# Patient Record
Sex: Female | Born: 1995 | Race: White | Hispanic: No | Marital: Single | Smoking: Never smoker
Health system: Southern US, Community
[De-identification: ages and names within clinical notes are randomized; demographics above are authoritative.]

## PROBLEM LIST (undated history)

## (undated) HISTORY — PX: TONSILLECTOMY: SUR1361

---
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
## 2016-10-01 ENCOUNTER — Encounter: Payer: Self-pay | Admitting: Certified Nurse Midwife

## 2016-10-11 ENCOUNTER — Ambulatory Visit (INDEPENDENT_AMBULATORY_CARE_PROVIDER_SITE_OTHER): Payer: Self-pay | Admitting: Certified Nurse Midwife

## 2016-10-11 ENCOUNTER — Encounter: Payer: Self-pay | Admitting: Certified Nurse Midwife

## 2016-10-11 VITALS — BP 127/86 | HR 66 | Ht 67.0 in | Wt 223.4 lb

## 2016-10-11 DIAGNOSIS — Z01419 Encounter for gynecological examination (general) (routine) without abnormal findings: Secondary | ICD-10-CM

## 2016-10-11 DIAGNOSIS — Z124 Encounter for screening for malignant neoplasm of cervix: Secondary | ICD-10-CM

## 2016-10-11 DIAGNOSIS — Z6834 Body mass index (BMI) 34.0-34.9, adult: Secondary | ICD-10-CM

## 2016-10-11 DIAGNOSIS — Z842 Family history of other diseases of the genitourinary system: Secondary | ICD-10-CM

## 2016-10-11 DIAGNOSIS — N6459 Other signs and symptoms in breast: Secondary | ICD-10-CM

## 2016-10-11 NOTE — Patient Instructions (Signed)
Diet for Polycystic Ovarian Syndrome Polycystic ovary syndrome (PCOS) is a disorder of the chemical messengers (hormones) that regulate menstruation. The condition causes important hormones to be out of balance. PCOS can:  Make your periods irregular or stop.  Cause cysts to develop on the ovaries.  Make it difficult to get pregnant.  Stop your body from responding to the effects of insulin (insulin resistance), which can lead to obesity and diabetes. Changing what you eat can help manage PCOS and improve your health. It can help you lose weight and improve the way your body uses insulin. What is my plan?  Eat breakfast, lunch, and dinner plus two snacks every day.  Include protein in each meal and snack.  Choose whole grains instead of products made with refined flour.  Eat a variety of foods.  Exercise regularly as told by your health care provider. What do I need to know about this eating plan? If you are overweight or obese, pay attention to how many calories you eat. Cutting down on calories can help you lose weight. Work with your health care provider or dietitian to figure out how many calories you need each day. What foods can I eat? Grains  Whole grains, such as whole wheat. Whole-grain breads, crackers, cereals, and pasta. Unsweetened oatmeal, bulgur, barley, quinoa, or brown rice. Corn or whole-wheat flour tortillas. Vegetables   Lettuce. Spinach. Peas. Beets. Cauliflower. Cabbage. Broccoli. Carrots. Tomatoes. Squash. Eggplant. Herbs. Peppers. Onions. Cucumbers. Brussels sprouts. Fruits  Berries. Bananas. Apples. Oranges. Grapes. Papaya. Mango. Pomegranate. Kiwi. Grapefruit. Cherries. Meats and Other Protein Sources  Lean proteins, such as fish, chicken, beans, eggs, and tofu. Dairy  Low-fat dairy products, such as skim milk, cheese sticks, and yogurt. Beverages  Low-fat or fat-free drinks, such as water, low-fat milk, sugar-free drinks, and 100% fruit  juice. Condiments  Ketchup. Mustard. Barbecue sauce. Relish. Low-fat or fat-free mayonnaise. Fats and Oils  Olive oil or canola oil. Walnuts and almonds. The items listed above may not be a complete list of recommended foods or beverages. Contact your dietitian for more options.  What foods are not recommended? Foods high in calories or fat. Fried foods. Sweets. Products made from refined white flour, including white bread, pastries, white rice, and pasta. The items listed above may not be a complete list of foods and beverages to avoid. Contact your dietitian for more information.  This information is not intended to replace advice given to you by your health care provider. Make sure you discuss any questions you have with your health care provider. Document Released: 09/25/2015 Document Revised: 11/09/2015 Document Reviewed: 06/15/2014 Elsevier Interactive Patient Education  2017 [employer].

## 2016-10-13 DIAGNOSIS — N6459 Other signs and symptoms in breast: Secondary | ICD-10-CM | POA: Insufficient documentation

## 2016-10-13 DIAGNOSIS — Z6834 Body mass index (BMI) 34.0-34.9, adult: Secondary | ICD-10-CM | POA: Insufficient documentation

## 2016-10-13 DIAGNOSIS — Z842 Family history of other diseases of the genitourinary system: Secondary | ICD-10-CM | POA: Insufficient documentation

## 2016-10-13 NOTE — Progress Notes (Signed)
ANNUAL PREVENTATIVE CARE GYN  ENCOUNTER NOTE  Subjective:       Linda Durham is a 21 y.o. G0P0000 female here for a routine annual gynecologic exam.  Her sister was recently diagnosed with PCOS and Linda Durham "sees similar symptoms in herself".   Linda Durham reports history of irregular cycles, excessive facial hair, ance, and thinning hair to her head.   Over the last six (6) months, she has last forty pounds and now her menstrual cycles are regular.   She reports bilateral inverted nipples, this is not a change, but is something she has not told anyone.   She requests lab work for confirmation of PCOS, if possible.   Denies difficulty breathing respiratory distress, chest pain, abdominal pain, dysuria, unexplained vaginal bleeding, and leg pain or swelling.   Gynecologic History  Patient's last menstrual period was 09/19/2016.  Contraception: abstinence  Last Pap: N/A.   Period Cycle (Days): 28 Period Duration (Days): 5-7 Period Pattern: Regular Menstrual Flow: Heavy Dysmenorrhea: (!) Mild Dysmenorrhea Symptoms: Cramping   Obstetric History OB History  Gravida Para Term Preterm AB Living  0 0 0 0 0 0  SAB TAB Ectopic Multiple Live Births  0 0 0 0 0        History reviewed. No pertinent past medical history.  Past Surgical History:  Procedure Laterality Date  . TONSILLECTOMY      No current outpatient prescriptions on file prior to visit.   No current facility-administered medications on file prior to visit.     No Known Allergies  Social History   Social History  . Marital status: Single    Spouse name: N/A  . Number of children: N/A  . Years of education: N/A   Occupational History  . Not on file.   Social History Main Topics  . Smoking status: Never Smoker  . Smokeless tobacco: Never Used  . Alcohol use Yes     Comment: Socially   . Drug use: No  . Sexual activity: No   Other Topics Concern  . Not on file   Social History Narrative  . No  narrative on file    Family History  Problem Relation Age of Onset  . Diabetes Father   . Heart disease Father   . Breast cancer Neg Hx   . Migraines Neg Hx   . Rashes / Skin problems Neg Hx     The following portions of the patient's history were reviewed and updated as appropriate: allergies, current medications, past family history, past medical history, past social history, past surgical history and problem list.  Review of Systems  ROS negative except as noted above. Information obtained from patient.    Objective:   BP 127/86 (BP Location: Left Arm, Patient Position: Sitting, Cuff Size: Normal)   Pulse 66   Ht  (1.702 m)   Wt 223 lb 6.4 oz (101.3 kg)   LMP 09/19/2016   BMI 34.99 kg/m    CONSTITUTIONAL: Well-developed, well-nourished female in no acute distress.   PSYCHIATRIC: Normal mood and affect. Normal behavior. Normal judgment and thought content.  NEUROLGIC: Alert and oriented to person, place, and time. Normal muscle tone coordination. No cranial nerve deficit noted.  HENT:  Normocephalic, atraumatic, External right and  left ear normal. Oropharynx is clear and moist  EYES: Conjunctivae and EOM are normal. Pupils are equal, round, and reactive to light. No scleral icterus.   NECK: Normal range of motion, supple, no masses.  Normal thyroid.  SKIN: Skin is warm and dry. No rash noted. Not diaphoretic. No erythema. No pallor.  CARDIOVASCULAR: Normal heart rate noted, regular rhythm, no murmur.  RESPIRATORY: Clear to auscultation bilaterally. Effort and breath sounds normal, no problems with respiration noted.  BREASTS: Symmetric in size. No masses, skin changes, nipple drainage, or lymphadenopathy. Bilateral inverted nipples.   ABDOMEN: Soft, normal bowel sounds, no distention noted.  No tenderness, rebound or guarding.   PELVIC:  External Genitalia: Normal  Vagina: Normal  Cervix: Normal  Uterus: Normal  Adnexa: Normal   MUSCULOSKELETAL:  Normal range of motion. No tenderness.  No cyanosis, clubbing, or edema.  2+ distal pulses.  LYMPHATIC: No Axillary, Supraclavicular, or Inguinal Adenopathy.  Assessment:   Annual gynecologic 39examination 20 y.o.   Contraception: abstinence   Obesity 1   Problem List Items Addressed This Visit      Other   Inverted nipple   BMI 34.0-34.9,adult   Relevant Orders   TSH   Hemoglobin A1c   Lipid panel   Family history of polycystic ovarian syndrome - Primary   Relevant Orders   TSH   Hemoglobin A1c   Lipid panel   Testosterone, Free, Total, SHBG    Other Visit Diagnoses    Well woman exam       Relevant Orders   Pap IG, CT/NG w/ reflex HPV when ASC-U   CBC   Comprehensive metabolic panel   TSH   Hemoglobin A1c   Lipid panel   Testosterone, Free, Total, SHBG   Pap smear for cervical cancer screening       Relevant Orders   Pap IG, CT/NG w/ reflex HPV when ASC-U      Plan:   Pap: Pap, Reflex if ASCUS  Labs: Lipid 1, FBS, TSH, Hemoglobin A1C and Vit D Level""See orders.    Routine preventative health maintenance measures emphasized: Exercise/Diet/Weight control, Alcohol/Substance use risks, Stress Management, Peer Pressure Issues and Safe Sex.   Suspect PCOS with cycle regulation due to Lifestyle modifications. Discussed types of PCOS and symptom management options with pt. Would like to wait for lab results before starting OCPs.   Return to Clinic - 1 Year or sooner if needed.    Gunnar Bulla, CNM

## 2016-10-14 ENCOUNTER — Encounter: Payer: Self-pay | Admitting: Certified Nurse Midwife

## 2016-10-15 ENCOUNTER — Other Ambulatory Visit: Payer: Self-pay

## 2016-10-15 DIAGNOSIS — Z6834 Body mass index (BMI) 34.0-34.9, adult: Secondary | ICD-10-CM

## 2016-10-15 DIAGNOSIS — Z01419 Encounter for gynecological examination (general) (routine) without abnormal findings: Secondary | ICD-10-CM

## 2016-10-15 DIAGNOSIS — Z842 Family history of other diseases of the genitourinary system: Secondary | ICD-10-CM

## 2016-10-15 LAB — PAP IG, CT-NG, RFX HPV ASCU
CHLAMYDIA, NUC. ACID AMP: NEGATIVE
Gonococcus by Nucleic Acid Amp: NEGATIVE
PAP Smear Comment: 0

## 2016-10-17 LAB — COMPREHENSIVE METABOLIC PANEL
A/G RATIO: 1.7 (ref 1.2–2.2)
ALK PHOS: 60 IU/L (ref 39–117)
ALT: 11 IU/L (ref 0–32)
AST: 17 IU/L (ref 0–40)
Albumin: 4.7 g/dL (ref 3.5–5.5)
BUN/Creatinine Ratio: 19 (ref 9–23)
BUN: 16 mg/dL (ref 6–20)
Bilirubin Total: 0.3 mg/dL (ref 0.0–1.2)
CO2: 23 mmol/L (ref 18–29)
Calcium: 9.6 mg/dL (ref 8.7–10.2)
Chloride: 98 mmol/L (ref 96–106)
Creatinine, Ser: 0.86 mg/dL (ref 0.57–1.00)
GFR calc Af Amer: 112 mL/min/{1.73_m2} (ref >59)
GFR, EST NON AFRICAN AMERICAN: 97 mL/min/{1.73_m2} (ref >59)
GLOBULIN, TOTAL: 2.7 g/dL (ref 1.5–4.5)
Glucose: 76 mg/dL (ref 65–99)
POTASSIUM: 4.4 mmol/L (ref 3.5–5.2)
SODIUM: 140 mmol/L (ref 134–144)
Total Protein: 7.4 g/dL (ref 6.0–8.5)

## 2016-10-17 LAB — LIPID PANEL
CHOL/HDL RATIO: 3.1 ratio (ref 0.0–4.4)
Cholesterol, Total: 174 mg/dL (ref 100–199)
HDL: 56 mg/dL (ref >39)
LDL CALC: 108 mg/dL — AB (ref 0–99)
TRIGLYCERIDES: 50 mg/dL (ref 0–149)
VLDL CHOLESTEROL CAL: 10 mg/dL (ref 5–40)

## 2016-10-17 LAB — HEMOGLOBIN A1C
Est. average glucose Bld gHb Est-mCnc: 108 mg/dL
Hgb A1c MFr Bld: 5.4 % (ref 4.8–5.6)

## 2016-10-17 LAB — TESTOSTERONE, FREE, TOTAL, SHBG
Sex Hormone Binding: 23 nmol/L — ABNORMAL LOW (ref 24.6–122.0)
TESTOSTERONE: 48 ng/dL (ref 8–48)
Testosterone, Free: 3.2 pg/mL (ref 0.0–4.2)

## 2016-10-17 LAB — CBC
HEMATOCRIT: 36.8 % (ref 34.0–46.6)
Hemoglobin: 11.5 g/dL (ref 11.1–15.9)
MCH: 25.4 pg — AB (ref 26.6–33.0)
MCHC: 31.3 g/dL — AB (ref 31.5–35.7)
MCV: 81 fL (ref 79–97)
Platelets: 331 10*3/uL (ref 150–379)
RBC: 4.53 x10E6/uL (ref 3.77–5.28)
RDW: 16 % — AB (ref 12.3–15.4)
WBC: 10.4 10*3/uL (ref 3.4–10.8)

## 2016-10-17 LAB — TSH: TSH: 1.81 u[IU]/mL (ref 0.450–4.500)

## 2016-10-29 ENCOUNTER — Telehealth: Payer: Self-pay | Admitting: Certified Nurse Midwife

## 2016-10-29 NOTE — Telephone Encounter (Signed)
Patient called stating she wants to speak with you regarding a medication that you suggested for her.Thanks

## 2016-10-29 NOTE — Telephone Encounter (Signed)
Patient lvm needing prescription refill, was not specific on which medication was needed. Patient would like to speak with a nurse to get refill. I lvm to find out what specific medication is needed, and for patient to return call.

## 2016-10-29 NOTE — Telephone Encounter (Signed)
Call to patient, verified full name and date of birth.   Discussed use of spirolactone in management of PCOS symptoms. Education provided on risk, benefits, and follow up. Pt declines Rx at this time.   Pt desires medical weight loss. Advised pt I would look into eligibility and contact via MyChart with further instructions.    Linda RoyalsMichelle Virgilio Broadhead, CNM

## 2016-10-31 ENCOUNTER — Telehealth: Payer: Self-pay | Admitting: Certified Nurse Midwife

## 2016-10-31 ENCOUNTER — Encounter: Payer: Self-pay | Admitting: Certified Nurse Midwife

## 2016-10-31 NOTE — Telephone Encounter (Signed)
Patient lvm stating she still has not received a message in MyChart for concerns and questions for medication. I lvm stating that Marcelino DusterMichelle has already responded and also informed the patient that we have a 24 hr turn around time. Please advise.

## 2016-11-01 NOTE — Telephone Encounter (Signed)
Would you please contact Linda Durham to schedule a weight loss visit with me. Let her know that I will be out of town next week, but back in office on Tuesday, May 29. Thanks, JML

## 2016-11-04 NOTE — Telephone Encounter (Signed)
Called patient and lvm to schedule appointment with Marcelino DusterMichelle for weight loss.

## 2016-11-04 NOTE — Telephone Encounter (Signed)
Appointment scheduled with patient for 11/14/2016

## 2016-11-14 ENCOUNTER — Ambulatory Visit (INDEPENDENT_AMBULATORY_CARE_PROVIDER_SITE_OTHER): Payer: Self-pay | Admitting: Certified Nurse Midwife

## 2016-11-14 ENCOUNTER — Encounter: Payer: Self-pay | Admitting: Certified Nurse Midwife

## 2016-11-14 VITALS — BP 108/69 | HR 72 | Ht 67.0 in | Wt 221.7 lb

## 2016-11-14 DIAGNOSIS — Z6834 Body mass index (BMI) 34.0-34.9, adult: Secondary | ICD-10-CM

## 2016-11-14 DIAGNOSIS — Z713 Dietary counseling and surveillance: Secondary | ICD-10-CM

## 2016-11-14 MED ORDER — CYANOCOBALAMIN 1000 MCG/ML IJ SOLN: 1000.0000 ug | Freq: Once | INTRAMUSCULAR | 2 refills | Status: AC

## 2016-11-14 MED ORDER — PHENTERMINE HCL 37.5 MG PO CAPS: 37.5000 mg | ORAL_CAPSULE | ORAL | 2 refills | Status: DC

## 2016-11-14 NOTE — Patient Instructions (Addendum)
Diet for Polycystic Ovarian Syndrome Polycystic ovary syndrome (PCOS) is a disorder of the chemical messengers (hormones) that regulate menstruation. The condition causes important hormones to be out of balance. PCOS can:  Make your periods irregular or stop.  Cause cysts to develop on the ovaries.  Make it difficult to get pregnant.  Stop your body from responding to the effects of insulin (insulin resistance), which can lead to obesity and diabetes. Cyanocobalamin, Vitamin B12 injection What is this medicine? CYANOCOBALAMIN (sye an oh koe BAL a min) is a man made form of vitamin B12. Vitamin B12 is used in the growth of healthy blood cells, nerve cells, and proteins in the body. It also helps with the metabolism of fats and carbohydrates. This medicine is used to treat people who can not absorb vitamin B12. This medicine may be used for other purposes; ask your health care provider or pharmacist if you have questions. COMMON BRAND NAME(S): B-12 Compliance Kit, B-12 Injection Kit, Cyomin, LA-12, Nutri-Twelve, Physicians EZ Use B-12, Primabalt What should I tell my health care provider before I take this medicine? They need to know if you have any of these conditions: -kidney disease -Leber's disease -megaloblastic anemia -an unusual or allergic reaction to cyanocobalamin, cobalt, other medicines, foods, dyes, or preservatives -pregnant or trying to get pregnant -breast-feeding How should I use this medicine? This medicine is injected into a muscle or deeply under the skin. It is usually given by a health care professional in a clinic or doctor's office. However, your doctor may teach you how to inject yourself. Follow all instructions. Talk to your pediatrician regarding the use of this medicine in children. Special care may be needed. Overdosage: If you think you have taken too much of this medicine contact a poison control center or emergency room at once. NOTE: This medicine is only  for you. Do not share this medicine with others. What if I miss a dose? If you are given your dose at a clinic or doctor's office, call to reschedule your appointment. If you give your own injections and you miss a dose, take it as soon as you can. If it is almost time for your next dose, take only that dose. Do not take double or extra doses. What may interact with this medicine? -colchicine -heavy alcohol intake This list may not describe all possible interactions. Give your health care provider a list of all the medicines, herbs, non-prescription drugs, or dietary supplements you use. Also tell them if you smoke, drink alcohol, or use illegal drugs. Some items may interact with your medicine. What should I watch for while using this medicine? Visit your doctor or health care professional regularly. You may need blood work done while you are taking this medicine. You may need to follow a special diet. Talk to your doctor. Limit your alcohol intake and avoid smoking to get the best benefit. What side effects may I notice from receiving this medicine? Side effects that you should report to your doctor or health care professional as soon as possible: -allergic reactions like skin rash, itching or hives, swelling of the face, lips, or tongue -blue tint to skin -chest tightness, pain -difficulty breathing, wheezing -dizziness -red, swollen painful area on the leg Side effects that usually do not require medical attention (report to your doctor or health care professional if they continue or are bothersome): -diarrhea -headache This list may not describe all possible side effects. Call your doctor for medical advice about side effects. You  may report side effects to FDA at 1-800-FDA-1088. Where should I keep my medicine? Keep out of the reach of children. Store at room temperature between 15 and 30 degrees C (59 and 85 degrees F). Protect from light. Throw away any unused medicine after the  expiration date. NOTE: This sheet is a summary. It may not cover all possible information. If you have questions about this medicine, talk to your doctor, pharmacist, or health care provider.  2018 Elsevier/Gold Standard (2007-09-14 22:10:20) Changing what you eat can help manage PCOS and improve your health. It can help you lose weight and improve the way your body uses insulin. What is my plan?  Eat breakfast, lunch, and dinner plus two snacks every day.  Include protein in each meal and snack.  Choose whole grains instead of products made with refined flour.  Eat a variety of foods.  Exercise regularly as told by your health care provider. What do I need to know about this eating plan? If you are overweight or obese, pay attention to how many calories you eat. Cutting down on calories can help you lose weight. Work with your health care provider or dietitian to figure out how many calories you need each day. What foods can I eat? Grains Whole grains, such as whole wheat. Whole-grain breads, crackers, cereals, and pasta. Unsweetened oatmeal, bulgur, barley, quinoa, or brown rice. Corn or whole-wheat flour tortillas. Vegetables  Lettuce. Spinach. Peas. Beets. Cauliflower. Cabbage. Broccoli. Carrots. Tomatoes. Squash. Eggplant. Herbs. Peppers. Onions. Cucumbers. Brussels sprouts. Fruits Berries. Bananas. Apples. Oranges. Grapes. Papaya. Mango. Pomegranate. Kiwi. Grapefruit. Cherries. Meats and Other Protein Sources Lean proteins, such as fish, chicken, beans, eggs, and tofu. Dairy Low-fat dairy products, such as skim milk, cheese sticks, and yogurt. Beverages Low-fat or fat-free drinks, such as water, low-fat milk, sugar-free drinks, and 100% fruit juice. Condiments Ketchup. Mustard. Barbecue sauce. Relish. Low-fat or fat-free mayonnaise. Fats and Oils Olive oil or canola oil. Walnuts and almonds. The items listed above may not be a complete list of recommended foods or beverages.  Contact your dietitian for more options. What foods are not recommended? Foods high in calories or fat. Fried foods. Sweets. Products made from refined white flour, including white bread, pastries, white rice, and pasta. The items listed above may not be a complete list of foods and beverages to avoid. Contact your dietitian for more information. This information is not intended to replace advice given to you by your health care provider. Make sure you discuss any questions you have with your health care provider. Document Released: 09/25/2015 Document Revised: 11/09/2015 Document Reviewed: 06/15/2014 Elsevier Interactive Patient Education  2018 [place] for [employer] Calories are units of energy. Your body needs a certain amount of calories from food to keep you going throughout the day. When you eat more calories than your body needs, your body stores the extra calories as fat. When you eat fewer calories than your body needs, your body [place] fat to get the energy it needs. Calorie counting means keeping track of how many calories you eat and drink each day. Calorie counting can be helpful if you need to lose weight. If you make sure to eat fewer calories than your body needs, you should lose weight. Ask your health care provider what a healthy weight is for you. For calorie counting to work, you will need to eat the right number of calories in a day in order to lose a healthy amount of weight  per week. A dietitian can help you determine how many calories you need in a day and will give you suggestions on how to reach your calorie goal.  A healthy amount of weight to lose per week is usually 1-2 lb (0.5-0.9 kg). This usually means that your daily calorie intake should be reduced by 500-750 calories.  Eating 1,200 - 1,500 calories per day can help most women lose weight.  Eating 1,500 - 1,800 calories per day can help most men lose weight.  What is my plan? My goal is  to have __________ calories per day. If I have this many calories per day, I should lose around __________ pounds per week. What do I need to know about calorie counting? In order to meet your daily calorie goal, you will need to:  Find out how many calories are in each food you would like to eat. Try to do this before you eat.  Decide how much of the food you plan to eat.  Write down what you ate and how many calories it had. Doing this is called keeping a food log.  To successfully lose weight, it is important to balance calorie counting with a healthy lifestyle that includes regular activity. Aim for 150 minutes of moderate exercise (such as walking) or 75 minutes of vigorous exercise (such as running) each week. Where do I find calorie information?  The number of calories in a food can be found on a Nutrition Facts label. If a food does not have a Nutrition Facts label, try to look up the calories online or ask your dietitian for help. Remember that calories are listed per serving. If you choose to have more than one serving of a food, you will have to multiply the calories per serving by the amount of servings you plan to eat. For example, the label on a package of bread might say that a serving size is 1 slice and that there are 90 calories in a serving. If you eat 1 slice, you will have eaten 90 calories. If you eat 2 slices, you will have eaten 180 calories. How do I keep a food log? Immediately after each meal, record the following information in your food log:  What you ate. Don't forget to include toppings, sauces, and other extras on the food.  How much you ate. This can be measured in cups, ounces, or number of items.  How many calories each food and drink had.  The total number of calories in the meal.  Keep your food log near you, such as in a small notebook in your pocket, or use a mobile app or website. Some programs will calculate calories for you and show you how many  calories you have left for the day to meet your goal. What are some calorie counting tips?  Use your calories on foods and drinks that will fill you up and not leave you hungry: ? Some examples of foods that fill you up are nuts and nut butters, vegetables, lean proteins, and high-fiber foods like whole grains. High-fiber foods are foods with more than 5 g fiber per serving. ? Drinks such as sodas, specialty coffee drinks, alcohol, and juices have a lot of calories, yet do not fill you up.  Eat nutritious foods and avoid empty calories. Empty calories are calories you get from foods or beverages that do not have many vitamins or protein, such as candy, sweets, and soda. It is better to have a nutritious high-calorie  food (such as an avocado) than a food with few nutrients (such as a bag of chips).  Know how many calories are in the foods you eat most often. This will help you calculate calorie counts faster.  Pay attention to calories in drinks. Low-calorie drinks include water and unsweetened drinks.  Pay attention to nutrition labels for "low fat" or "fat free" foods. These foods sometimes have the same amount of calories or more calories than the full fat versions. They also often have added sugar, starch, or salt, to make up for flavor that was removed with the fat.  Find a way of tracking calories that works for you. Get creative. Try different apps or programs if writing down calories does not work for you. What are some portion control tips?  Know how many calories are in a serving. This will help you know how many servings of a certain food you can have.  Use a measuring cup to measure serving sizes. You could also try weighing out portions on a kitchen scale. With time, you will be able to estimate serving sizes for some foods.  Take some time to put servings of different foods on your favorite plates, bowls, and cups so you know what a serving looks like.  Try not to eat straight  from a bag or box. Doing this can lead to overeating. Put the amount you would like to eat in a cup or on a plate to make sure you are eating the right portion.  Use smaller plates, glasses, and bowls to prevent overeating.  Try not to multitask (for example, watch TV or use your computer) while eating. If it is time to eat, sit down at a table and enjoy your food. This will help you to know when you are full. It will also help you to be aware of what you are eating and how much you are eating. What are tips for following this plan? Reading food labels  Check the calorie count compared to the serving size. The serving size may be smaller than what you are used to eating.  Check the source of the calories. Make sure the food you are eating is high in vitamins and protein and low in saturated and trans fats. Shopping  Read nutrition labels while you shop. This will help you make healthy decisions before you decide to purchase your food.  Make a grocery list and stick to it. Cooking  Try to cook your favorite foods in a healthier way. For example, try baking instead of frying.  Use low-fat dairy products. Meal planning  Use more fruits and vegetables. Half of your plate should be fruits and vegetables.  Include lean proteins like poultry and fish. How do I count calories when eating out?  Ask for smaller portion sizes.  Consider sharing an entree and sides instead of getting your own entree.  If you get your own entree, eat only half. Ask for a box at the beginning of your meal and put the rest of your entree in it so you are not tempted to eat it.  If calories are listed on the menu, choose the lower calorie options.  Choose dishes that include vegetables, fruits, whole grains, low-fat dairy products, and lean protein.  Choose items that are boiled, broiled, grilled, or steamed. Stay away from items that are buttered, battered, fried, or served with cream sauce. Items labeled  "crispy" are usually fried, unless stated otherwise.  Choose water, low-fat milk, unsweetened  iced tea, or other drinks without added sugar. If you want an alcoholic beverage, choose a lower calorie option such as a glass of wine or light beer.  Ask for dressings, sauces, and syrups on the side. These are usually high in calories, so you should limit the amount you eat.  If you want a salad, choose a garden salad and ask for grilled meats. Avoid extra toppings like bacon, cheese, or fried items. Ask for the dressing on the side, or ask for olive oil and vinegar or lemon to use as dressing.  Estimate how many servings of a food you are given. For example, a serving of cooked rice is  cup or about the size of half a baseball. Knowing serving sizes will help you be aware of how much food you are eating at restaurants. The list below tells you how big or small some common portion sizes are based on everyday objects: ? 1 oz-4 stacked dice. ? 3 oz-1 deck of cards. ? 1 tsp-1 die. ? 1 Tbsp- a ping-pong ball. ? 2 Tbsp-1 ping-pong ball. ?  cup- baseball. ? 1 cup-1 baseball. Summary  Calorie counting means keeping track of how many calories you eat and drink each day. If you eat fewer calories than your body needs, you should lose weight.  A healthy amount of weight to lose per week is usually 1-2 lb (0.5-0.9 kg). This usually means reducing your daily calorie intake by 500-750 calories.  The number of calories in a food can be found on a Nutrition Facts label. If a food does not have a Nutrition Facts label, try to look up the calories online or ask your dietitian for help.  Use your calories on foods and drinks that will fill you up, and not on foods and drinks that will leave you hungry.  Use smaller plates, glasses, and bowls to prevent overeating. This information is not intended to replace advice given to you by your health care provider. Make sure you discuss any questions you have with  your health care provider. Document Released: 06/03/2005 Document Revised: 05/03/2016 Document Reviewed: 05/03/2016 Elsevier Interactive Patient Education  2017 [place]. Phentermine tablets or capsules What is this medicine? PHENTERMINE (FEN ter meen) decreases your appetite. It is used with a reduced calorie diet and exercise to help you lose weight. This medicine may be used for other purposes; ask your health care provider or pharmacist if you have questions. COMMON BRAND NAME(S): Adipex-P, Atti-Plex P, Atti-Plex P Spansule, Fastin, Lomaira, Pro-Fast, Tara-8 What should I tell my health care provider before I take this medicine? They need to know if you have any of these conditions: -agitation -glaucoma -heart disease -high blood pressure -history of substance abuse -lung disease called Primary Pulmonary Hypertension (PPH) -taken an MAOI like Carbex, Eldepryl, Marplan, Nardil, or Parnate in last 14 days -thyroid disease -an unusual or allergic reaction to phentermine, other medicines, foods, dyes, or preservatives -pregnant or trying to get pregnant -breast-feeding How should I use this medicine? Take this medicine by mouth with a glass of water. Follow the directions on the prescription label. The instructions for use may differ based on the product and dose you are taking. Avoid taking this medicine in the evening. It may interfere with sleep. Take your doses at regular intervals. Do not take your medicine more often than directed. Talk to your pediatrician regarding the use of this medicine in children. While this drug may be prescribed for children 17 years or older  for selected conditions, precautions do apply. Overdosage: If you think you have taken too much of this medicine contact a poison control center or emergency room at once. NOTE: This medicine is only for you. Do not share this medicine with others. What if I miss a dose? If you miss a dose, take it as soon as you  can. If it is almost time for your next dose, take only that dose. Do not take double or extra doses. What may interact with this medicine? Do not take this medicine with any of the following medications: -duloxetine -MAOIs like Carbex, Eldepryl, Marplan, Nardil, and Parnate -medicines for colds or breathing difficulties like pseudoephedrine or phenylephrine -procarbazine -sibutramine -SSRIs like citalopram, escitalopram, fluoxetine, fluvoxamine, paroxetine, and sertraline -stimulants like dexmethylphenidate, methylphenidate or modafinil -venlafaxine This medicine may also interact with the following medications: -medicines for diabetes This list may not describe all possible interactions. Give your health care provider a list of all the medicines, herbs, non-prescription drugs, or dietary supplements you use. Also tell them if you smoke, drink alcohol, or use illegal drugs. Some items may interact with your medicine. What should I watch for while using this medicine? Notify your physician immediately if you become short of breath while doing your normal activities. Do not take this medicine within 6 hours of bedtime. It can keep you from getting to sleep. Avoid drinks that contain caffeine and try to stick to a regular bedtime every night. This medicine was intended to be used in addition to a healthy diet and exercise. The best results are achieved this way. This medicine is only indicated for short-term use. Eventually your weight loss may level out. At that point, the drug will only help you maintain your new weight. Do not increase or in any way change your dose without consulting your doctor. You may get drowsy or dizzy. Do not drive, use machinery, or do anything that needs mental alertness until you know how this medicine affects you. Do not stand or sit up quickly, especially if you are an older patient. This reduces the risk of dizzy or fainting spells. Alcohol may increase dizziness and  drowsiness. Avoid alcoholic drinks. What side effects may I notice from receiving this medicine? Side effects that you should report to your doctor or health care professional as soon as possible: -chest pain, palpitations -depression or severe changes in mood -increased blood pressure -irritability -nervousness or restlessness -severe dizziness -shortness of breath -problems urinating -unusual swelling of the legs -vomiting Side effects that usually do not require medical attention (report to your doctor or health care professional if they continue or are bothersome): -blurred vision or other eye problems -changes in sexual ability or desire -constipation or diarrhea -difficulty sleeping -dry mouth or unpleasant taste -headache -nausea This list may not describe all possible side effects. Call your doctor for medical advice about side effects. You may report side effects to FDA at 1-800-FDA-1088. Where should I keep my medicine? Keep out of the reach of children. This medicine can be abused. Keep your medicine in a safe place to protect it from theft. Do not share this medicine with anyone. Selling or giving away this medicine is dangerous and against the law. This medicine may cause accidental overdose and death if taken by other adults, children, or pets. Mix any unused medicine with a substance like cat litter or coffee grounds. Then throw the medicine away in a sealed container like a sealed bag or a coffee can with a  lid. Do not use the medicine after the expiration date. Store at room temperature between 20 and 25 degrees C (68 and 77 degrees F). Keep container tightly closed. NOTE: This sheet is a summary. It may not cover all possible information. If you have questions about this medicine, talk to your doctor, pharmacist, or health care provider.  2018 Elsevier/Gold Standard (2015-03-10 12:53:15)

## 2016-11-14 NOTE — Progress Notes (Signed)
Subjective:   Linda Durham is a 21 y.o. G0P0000 being seen today for weight loss management- initial visit.  Patient requests assistance with weight loss after successfully loosing 60 pounds with diet, exercise, and lifestyle modifications. She would like to start daily phentermine capsules.   Previous treatments include: small frequent feedings, nutritional supplement, vitamin supplement, and exercise.  Pertinent medical history includes: polycystic ovarian syndrome.   The patient has a surgical history of: tonsillectomy.   Past evaluation has included: lipid panel, hemoglobin A1c, TSH, CMP, CBC, total and free Testerone level.   Denies difficulty breathing or respiratory distress, chest pain, abdominal pain, vaginal bleeding, dysuria, and leg pain or swelling.   The following portions of the patient's history were reviewed and updated as appropriate: allergies, current medications, past family history, past medical history, past social history, past surgical history and problem list.   Review of Systems  Review of systems negative except as noted above. Information obtained from patient.   Objective:   Vitals:   11/14/16 1616  BP: 108/69  Pulse: 72  Weight: 221 lb 11.2 oz (100.6 kg)  Height: 5\' 7"  (1.702 m)   Current BMI: Body mass index is 34.72 kg/m.   Alert and oriented x 4, no apparent distress.   Physical exam: not indicated at this time.   Assessment:   Obesity  BMI 34.0-34.9, adult  Weight loss counseling, encounter for  Hx Polycystic Ovarian Syndrome  Plan:   Encouraged Low carb, High protein diet.   Advised to increase daily water intake to at least eight (8) bottles a day, every day.   RX for adipex 37.5 mg daily and B12 1000mcg.ml monthly, to start now with first injection given at today's visit, see orders.   Reviewed medication side effects and expected outcomes.  Discussed goal is to reduse weight by 10% at the end of three months, and will  re-evaluate then.  RTC in 4 weeks for Nurse visit to check weight & BP, and get next B12 injections.  Please refer to After Visit Summary for other counseling recommendations.    Gunnar BullaJenkins Michelle Brissa Asante, CNM

## 2016-12-13 ENCOUNTER — Ambulatory Visit (INDEPENDENT_AMBULATORY_CARE_PROVIDER_SITE_OTHER): Payer: Self-pay | Admitting: Obstetrics and Gynecology

## 2016-12-13 ENCOUNTER — Encounter: Payer: Self-pay | Admitting: Obstetrics and Gynecology

## 2016-12-13 VITALS — BP 129/82 | HR 84 | Wt 208.4 lb

## 2016-12-13 DIAGNOSIS — E663 Overweight: Secondary | ICD-10-CM

## 2016-12-13 MED ORDER — CYANOCOBALAMIN 1000 MCG/ML IJ SOLN
1000.0000 ug | Freq: Once | INTRAMUSCULAR | Status: AC
  Administered 2016-12-13: 1000 ug via INTRAMUSCULAR

## 2016-12-13 NOTE — Progress Notes (Signed)
Pt is here for wt, bp check, b-12 inj She is doing well, denies any s/e  12/13/16 wt- 208.4lb 11/14/16 wt- 221lb

## 2017-01-10 ENCOUNTER — Encounter: Payer: Self-pay | Admitting: Obstetrics and Gynecology

## 2017-01-16 ENCOUNTER — Encounter: Payer: Self-pay | Admitting: Obstetrics and Gynecology

## 2017-01-17 ENCOUNTER — Ambulatory Visit (INDEPENDENT_AMBULATORY_CARE_PROVIDER_SITE_OTHER): Payer: Self-pay | Admitting: Certified Nurse Midwife

## 2017-01-17 VITALS — BP 131/85 | HR 79 | Ht 67.0 in | Wt 199.4 lb

## 2017-01-17 DIAGNOSIS — E669 Obesity, unspecified: Secondary | ICD-10-CM

## 2017-01-17 DIAGNOSIS — R5383 Other fatigue: Secondary | ICD-10-CM

## 2017-01-17 DIAGNOSIS — Z6832 Body mass index (BMI) 32.0-32.9, adult: Secondary | ICD-10-CM

## 2017-01-17 MED ORDER — CYANOCOBALAMIN 1000 MCG/ML IJ SOLN
1000.0000 ug | Freq: Once | INTRAMUSCULAR | Status: AC
  Administered 2017-01-17: 1000 ug via INTRAMUSCULAR

## 2017-01-17 NOTE — Patient Instructions (Signed)

## 2017-01-17 NOTE — Progress Notes (Signed)
BP 131/85 (BP Location: Left Arm, Patient Position: Sitting, Cuff Size: Normal)   Pulse 79   Ht 5\' 7"  (1.702 m)   Wt 199 lb 6.4 oz (90.4 kg)   BMI 31.23 kg/m   Pt presents for weight, B/P, B-12 injection. No side effects of medication-Phentermine, or B-12.  Weight loss of __9___ lbs. Encouraged eating healthy and exercise. To follow up in 4 weeks with JML.

## 2017-01-20 ENCOUNTER — Telehealth: Payer: Self-pay | Admitting: *Deleted

## 2017-01-20 NOTE — Telephone Encounter (Signed)
Pt was on Melody's schedule on 01/16/17, I called pt to see if she could come on 01/17/17 due to nurse (Amy) was going over with Melody to deliver a baby.  She agreed to come on 01/17/17.  I worked with Melody on 01/17/17, she had been working through lunch all week, so I had been working through as well and leaving early.  I left on Friday and totally forgot all about Rolly SalterHaley coming in for appointment.  I called her on 01/20/17 and apologized for not being here and that I simply made a mistake

## 2017-01-21 NOTE — Progress Notes (Signed)
I have reviewed the record and concur with patient management and plan of care.    Jenkins Michelle Lawhorn, CNM [facility], [facility] 

## 2017-02-14 ENCOUNTER — Ambulatory Visit (INDEPENDENT_AMBULATORY_CARE_PROVIDER_SITE_OTHER): Payer: Self-pay | Admitting: Certified Nurse Midwife

## 2017-02-14 ENCOUNTER — Encounter: Payer: Self-pay | Admitting: Certified Nurse Midwife

## 2017-02-14 VITALS — BP 130/88 | HR 65 | Ht 67.0 in | Wt 196.1 lb

## 2017-02-14 DIAGNOSIS — E669 Obesity, unspecified: Secondary | ICD-10-CM

## 2017-02-14 DIAGNOSIS — Z3009 Encounter for other general counseling and advice on contraception: Secondary | ICD-10-CM

## 2017-02-14 DIAGNOSIS — Z832 Family history of diseases of the blood and blood-forming organs and certain disorders involving the immune mechanism: Secondary | ICD-10-CM

## 2017-02-14 MED ORDER — PHENTERMINE HCL 37.5 MG PO CAPS: 37.5000 mg | ORAL_CAPSULE | ORAL | 2 refills | Status: DC

## 2017-02-14 MED ORDER — CYANOCOBALAMIN 1000 MCG/ML IJ SOLN
1000.0000 ug | Freq: Once | INTRAMUSCULAR | Status: AC
  Administered 2017-02-14: 1000 ug via INTRAMUSCULAR

## 2017-02-14 MED ORDER — CYANOCOBALAMIN 1000 MCG/ML IJ SOLN: 1000.0000 ug | Freq: Once | INTRAMUSCULAR | 2 refills | Status: AC

## 2017-02-14 NOTE — Patient Instructions (Signed)
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 This means that it is harder for your body to stop bleeding, especially after surgery or injury. Factor V is a protein in the blood that helps your blood to clot (blood coagulation factor). If you have a factor V deficiency, your body does not produce enough of this protein, or the protein does not work the way it should. Most cases of factor V deficiency are not severe. What are the causes? Factor V deficiency is almost always caused by a defective factor V gene passed down (inherited) from both parents. In rare cases, the body can develop an antibody that causes factor V deficiency. This can occur after certain medical procedures or after giving birth. It can also happen if you have an autoimmune disease or cancer. What increases the risk? You may be at higher risk for factor V deficiency if you have:  A family history of the condition.  A family history of bleeding disorders.  An autoimmune disease.  Certain cancers.  What are the signs or symptoms? Most symptoms of factor V deficiency involve heavy or abnormal bleeding. They include:  Abnormal bleeding after injury, surgery, or childbirth.  Bleeding under the skin.  Bleeding gums.  Frequent nosebleeds.  Bruising.  Menstrual periods that are unusually long and heavy.  Internal bleeding.  Bleeding from the umbilical cord at birth.  How is this diagnosed? Your health care provider may suspect factor V deficiency if you have symptoms of the condition as well as a family or personal history of bleeding problems. A physical exam will be done. Blood tests may also be done to help make a diagnosis. These tests may include:  Factor assays. These tests are used to check the level of certain clotting factors and how well they work.  Prothrombin time and partial prothrombin time. This test measures how long it takes  your blood to clot.  Inhibitor tests. These tests determine whether your body's immune system affects clotting factors.  How is this treated? Treatment for factor V deficiency may include the following:  Blood transfusions of fresh frozen plasma (FFP) may be done if you have severe bleeding. This may also be done as a precaution whenever you have surgery.  A nasal spray that raises factor levels (desmopressin) may be used before any medical procedures.  Birth control pills may be used to control heavy menstrual bleeding.  Follow these instructions at home:  Be extra careful to avoid injuries or accidents.  Take medicines only as directed by your health care provider.  Tell all of your health care providers that you have factor V deficiency, especially before having a medical or dental procedure.  Wear a medical alert bracelet in case of emergency.  Take steps at home to reduce the risk of bleeding, such as using a soft toothbrush and an electric razor.  Include plenty of fiber in your diet to prevent constipation and reduce your risk for rectal bleeding.  Do not use enemas or rectal thermometers. Contact a health care provider if:  You have excessive or persistent bleeding.  Your skin bruises very easily.  Your menstrual periods are very heavy or last longer than normal. Get help right away if: You have unusual or severe blood loss. This information is not intended to replace advice given to you by your health care provider. Make sure you discuss any questions you have with your health care   provider. Document Released: 12/29/2013 Document Revised: 11/09/2015 Document Reviewed: 11/01/2013 Elsevier Interactive Patient Education  [employer]2018 Elsevier Inc.

## 2017-02-21 ENCOUNTER — Telehealth: Payer: Self-pay | Admitting: Certified Nurse Midwife

## 2017-02-21 NOTE — Telephone Encounter (Signed)
Patient called stating that she would like to know her results, The patient said she was going out of town. I informed the patient that her results are back yet and that she will receive a call once her results are reviewed by her provider. Please advise.

## 2017-02-22 LAB — FACTOR 5 LEIDEN

## 2017-02-22 LAB — PT AND PTT
INR: 1 (ref 0.8–1.2)
PROTHROMBIN TIME: 10.4 s (ref 9.1–12.0)
aPTT: 38 s — ABNORMAL HIGH (ref 24–33)

## 2017-02-23 NOTE — Progress Notes (Signed)
SUBJECTIVE:  21 y.o. here for follow-up weight loss visit, previously seen 12 weeks ago. Denies any concerns and feels like medication is working well.   Rolly SalterHaley also desires contraception at this time. Family history significant for Factor V Leiden and family member with blood clot due to OCP usage.   Denies difficulty breathing or respiratory distress, chest pain, abdominal pain, excessive vaginal bleeding, dysuria, and leg pain or swelling.   OBJECTIVE:  BP 130/88 (BP Location: Left Arm, Patient Position: Sitting, Cuff Size: Normal)   Pulse 65   Ht 5\' 7"  (1.702 m)   Wt 196 lb 1.6 oz (89 kg)   LMP 11/21/2016   BMI 30.71 kg/m   Body mass index is 30.71 kg/m.   ASSESSMENT:  Obesity- responding well to weight loss plan. Desires contraception.   PLAN:  To continue with current medications. B12 105700mcg/ml injection given.   Labs: PT and PTT, Factor V leiden; see orders.   Education given regarding options for contraception, including injectable contraception, IUD placement, oral contraceptives.  Will contact patient with lab results and start contraception based on outcome.   RTC in 4 weeks as planned for blood pressure check, B12 injection, and weight check.    Gunnar BullaJenkins Michelle Shatoria [place], CNM

## 2017-02-24 ENCOUNTER — Encounter: Payer: Self-pay | Admitting: Certified Nurse Midwife

## 2017-02-24 NOTE — Telephone Encounter (Signed)
Please contact patient. Results reviewed. Patient okay for contraception. Factor V negative. APTT slightly elevated which means slow blood clotting. Please check with patient and see if family member with blood clot was sister or mother's sister. If family member was sister, then would advise progesterone only agent like Camila due to increased familial risk. If family member was mother's sister, then would advise low dose pill like Junel 1/20. Also, sending results via MyChart. Thanks, JML

## 2017-02-24 NOTE — Telephone Encounter (Signed)
Please review labs and advise.

## 2017-02-26 ENCOUNTER — Other Ambulatory Visit: Payer: Self-pay | Admitting: Certified Nurse Midwife

## 2017-02-26 MED ORDER — NORETHIN ACE-ETH ESTRAD-FE 1-20 MG-MCG PO TABS: 1.0000 | ORAL_TABLET | Freq: Every day | ORAL | 11 refills | Status: DC

## 2017-02-26 NOTE — Telephone Encounter (Signed)
Called pt she states that it was her sister (although half sister) who has clotting issues. Looks like you already script in for Junel so I did not send Camilia.

## 2017-03-17 ENCOUNTER — Encounter: Payer: Self-pay | Admitting: Obstetrics and Gynecology

## 2017-03-17 ENCOUNTER — Ambulatory Visit (INDEPENDENT_AMBULATORY_CARE_PROVIDER_SITE_OTHER): Payer: Self-pay | Admitting: Obstetrics and Gynecology

## 2017-03-17 VITALS — BP 117/72 | HR 73 | Wt 189.4 lb

## 2017-03-17 DIAGNOSIS — E663 Overweight: Secondary | ICD-10-CM

## 2017-03-17 MED ORDER — CYANOCOBALAMIN 1000 MCG/ML IJ SOLN
1000.0000 ug | Freq: Once | INTRAMUSCULAR | Status: AC
  Administered 2017-03-17: 1000 ug via INTRAMUSCULAR

## 2017-03-17 NOTE — Progress Notes (Signed)
Pt is here for wt, bp check, b-12 inj She is doing well, denies any s/e and is so happy with her weight loss  03/17/17 wt- 189.4lb 02/14/17 wt- 196

## 2017-04-14 ENCOUNTER — Ambulatory Visit (INDEPENDENT_AMBULATORY_CARE_PROVIDER_SITE_OTHER): Payer: Self-pay | Admitting: Obstetrics and Gynecology

## 2017-04-14 VITALS — BP 130/79 | HR 77 | Wt 182.0 lb

## 2017-04-14 DIAGNOSIS — Z6832 Body mass index (BMI) 32.0-32.9, adult: Secondary | ICD-10-CM

## 2017-04-14 DIAGNOSIS — E669 Obesity, unspecified: Secondary | ICD-10-CM

## 2017-04-14 NOTE — Progress Notes (Signed)
Pt is here for wt, bp check, b-12 inj she denies any s/e She is doing well   04/14/17 wt- 182lb 03/17/17 wt- 189lb

## 2017-05-12 ENCOUNTER — Ambulatory Visit: Payer: Medicaid Other | Admitting: Obstetrics and Gynecology

## 2017-11-17 ENCOUNTER — Encounter: Payer: Self-pay | Admitting: Certified Nurse Midwife

## 2017-12-15 ENCOUNTER — Other Ambulatory Visit: Payer: Self-pay

## 2017-12-15 MED ORDER — NORETHIN ACE-ETH ESTRAD-FE 1-20 MG-MCG PO TABS: 1.0000 | ORAL_TABLET | Freq: Every day | ORAL | 0 refills | Status: DC

## 2018-01-22 ENCOUNTER — Encounter: Payer: Self-pay | Admitting: Certified Nurse Midwife

## 2018-01-26 ENCOUNTER — Ambulatory Visit (INDEPENDENT_AMBULATORY_CARE_PROVIDER_SITE_OTHER): Payer: Medicaid Other | Admitting: Certified Nurse Midwife

## 2018-01-26 VITALS — BP 120/87 | HR 76 | Ht 67.0 in | Wt 201.0 lb

## 2018-01-26 DIAGNOSIS — Z01419 Encounter for gynecological examination (general) (routine) without abnormal findings: Secondary | ICD-10-CM

## 2018-01-26 DIAGNOSIS — Z3041 Encounter for surveillance of contraceptive pills: Secondary | ICD-10-CM

## 2018-01-26 DIAGNOSIS — Z309 Encounter for contraceptive management, unspecified: Secondary | ICD-10-CM | POA: Diagnosis not present

## 2018-01-26 MED ORDER — NORETHIN ACE-ETH ESTRAD-FE 1-20 MG-MCG PO TABS
1.0000 | ORAL_TABLET | Freq: Every day | ORAL | 4 refills | Status: DC
Start: 2018-01-26 — End: 2019-04-23

## 2018-01-26 NOTE — Progress Notes (Signed)
ANNUAL PREVENTATIVE CARE GYN  ENCOUNTER NOTE  Subjective:       Linda Durham is a 22 y.o. G0P0000 female here for a routine annual gynecologic exam.  Current complaints: 1. Needs birth control refill  Denies difficulty breathing or respiratory distress, chest pain, abdominal pain, excessive vaginal bleeding, dysuria, and leg pain or swelling.    Gynecologic History  Patient's last menstrual period was 01/21/2018 (exact date).  Contraception: OCP (estrogen/progesterone)  Last Pap: 09/2016. Results were: normal  Obstetric History  OB History  Gravida Durham Term Preterm AB Living  0 0 0 0 0 0  SAB TAB Ectopic Multiple Live Births  0 0 0 0 0    No past medical history on file.  Past Surgical History:  Procedure Laterality Date  . TONSILLECTOMY      Current Outpatient Medications on File Prior to Visit  Medication Sig Dispense Refill  . phentermine 37.5 MG capsule Take 1 capsule (37.5 mg total) by mouth every morning. (Patient not taking: Reported on 01/26/2018) 30 capsule 2   No current facility-administered medications on file prior to visit.     No Known Allergies  Social History   Socioeconomic History  . Marital status: Single    Spouse name: Not on file  . Number of children: Not on file  . Years of education: Not on file  . Highest education level: Not on file  Occupational History  . Not on file  Social Needs  . Financial resource strain: Not on file  . Food insecurity:    Worry: Not on file    Inability: Not on file  . Transportation needs:    Medical: Not on file    Non-medical: Not on file  Tobacco Use  . Smoking status: Never Smoker  . Smokeless tobacco: Never Used  Substance and Sexual Activity  . Alcohol use: Yes    Comment: Socially   . Drug use: No  . Sexual activity: Never    Birth control/protection: None, Abstinence  Lifestyle  . Physical activity:    Days per week: Not on file    Minutes per session: Not on file  . Stress: Not on  file  Relationships  . Social connections:    Talks on phone: Not on file    Gets together: Not on file    Attends religious service: Not on file    Active member of club or organization: Not on file    Attends meetings of clubs or organizations: Not on file    Relationship status: Not on file  . Intimate partner violence:    Fear of current or ex partner: Not on file    Emotionally abused: Not on file    Physically abused: Not on file    Forced sexual activity: Not on file  Other Topics Concern  . Not on file  Social History Narrative  . Not on file    Family History  Problem Relation Age of Onset  . Diabetes Father   . Heart disease Father   . Breast cancer Neg Hx   . Migraines Neg Hx   . Rashes / Skin problems Neg Hx     The following portions of the patient's history were reviewed and updated as appropriate: allergies, current medications, past family history, past medical history, past social history, past surgical history and problem list.  Review of Systems  ROS negative as noted above. Information obtained from patient.    Objective:   BP 120/87  Pulse 76   Ht 5\' 7"  (1.702 m)   Wt 201 lb (91.2 kg)   BMI 31.48 kg/m    CONSTITUTIONAL: Well-developed, well-nourished female in no acute distress.   PSYCHIATRIC: Normal mood and affect. Normal behavior. Normal judgment and thought content.  NEUROLGIC: Alert and oriented to person, place, and time. Normal muscle tone coordination. No cranial  nerve deficit noted.  HENT:  Normocephalic, atraumatic, External right and left ear normal.   EYES: Conjunctivae and EOM are normal. Pupils are equal and round.     NECK: Normal range of motion, supple, no masses.  Normal thyroid.   SKIN: Skin is warm and dry. No rash noted. Not diaphoretic. No erythema. No pallor.  CARDIOVASCULAR: Normal heart rate noted, regular rhythm, no murmur.  RESPIRATORY: Clear to auscultation bilaterally. Effort and breath sounds normal, no  problems with respiration noted.  BREASTS: Declined.   ABDOMEN: Soft, normal bowel sounds, no distention noted.  No tenderness, rebound or guarding.   PELVIC: Declined.   MUSCULOSKELETAL: Normal range of motion. No tenderness.  No cyanosis, clubbing, or edema.  2+ distal pulses.  LYMPHATIC: No Axillary, Supraclavicular, or Inguinal Adenopathy.  Assessment:   Annual gynecologic examination 22 y.o.   Contraception: OCP (estrogen/progesterone)   Obesity 1   Problem List Items Addressed This Visit    None      Plan:   Pap: Not needed  Labs: Declined   Routine preventative health maintenance measures emphasized: Exercise/Diet/Weight control, Tobacco Warnings, Alcohol/Substance use risks and Stress Management; see AVS  Rx: Junel, see orders  Reviewed red flag symptoms and when to call  RTC x 1 year for Annual Exam or sooner if needed   Gunnar BullaJenkins Michelle Atzel [place], CNM [facility], [facility]

## 2018-01-26 NOTE — Patient Instructions (Signed)
Ethinyl Estradiol; Norethindrone Acetate; Ferrous fumarate tablets or capsules What is this medicine? ETHINYL ESTRADIOL; NORETHINDRONE ACETATE; FERROUS FUMARATE (ETH in il es tra DYE ole; nor eth IN drone AS e tate; FER us FUE ma rate) is an oral contraceptive. The products combine two types of female hormones, an estrogen and a progestin. They are used to prevent ovulation and pregnancy. Some products are also used to treat acne in females. This medicine may be used for other purposes; ask your health care provider or pharmacist if you have questions. COMMON BRAND NAME(S): Blisovi 24 Fe, Blisovi Fe, Estrostep Fe, Gildess 24 Fe, Gildess Fe 1.5/30, Gildess Fe 1/20, Junel Fe 1.5/30, Junel Fe 1/20, Junel Fe 24, Larin Fe, Lo Loestrin Fe, Loestrin 24 Fe, Loestrin FE 1.5/30, Loestrin FE 1/20, Lomedia 24 Fe, Microgestin 24 Fe, Microgestin Fe 1.5/30, Microgestin Fe 1/20, Tarina Fe 1/20, Taytulla, Tilia Fe, Tri-Legest Fe What should I tell my health care provider before I take this medicine? They need to know if you have any of these conditions: -abnormal vaginal bleeding -blood vessel disease -breast, cervical, endometrial, ovarian, liver, or uterine cancer -diabetes -gallbladder disease -heart disease or recent heart attack -high blood pressure -high cholesterol -history of blood clots -kidney disease -liver disease -migraine headaches -smoke tobacco -stroke -systemic lupus erythematosus (SLE) -an unusual or allergic reaction to estrogens, progestins, other medicines, foods, dyes, or preservatives -pregnant or trying to get pregnant -breast-feeding How should I use this medicine? Take this medicine by mouth. To reduce nausea, this medicine may be taken with food. Follow the directions on the prescription label. Take this medicine at the same time each day and in the order directed on the package. Do not take your medicine more often than directed. A patient package insert for the product will be  given with each prescription and refill. Read this sheet carefully each time. The sheet may change frequently. Contact your pediatrician regarding the use of this medicine in children. Special care may be needed. This medicine has been used in female children who have started having menstrual periods. Overdosage: If you think you have taken too much of this medicine contact a poison control center or emergency room at once. NOTE: This medicine is only for you. Do not share this medicine with others. What if I miss a dose? If you miss a dose, refer to the patient information sheet you received with your medicine for direction. If you miss more than one pill, this medicine may not be as effective and you may need to use another form of birth control. What may interact with this medicine? Do not take this medicine with the following medication: -dasabuvir; ombitasvir; paritaprevir; ritonavir -ombitasvir; paritaprevir; ritonavir This medicine may also interact with the following medications: -acetaminophen -antibiotics or medicines for infections, especially rifampin, rifabutin, rifapentine, and griseofulvin, and possibly penicillins or tetracyclines -aprepitant -ascorbic acid (vitamin C) -atorvastatin -barbiturate medicines, such as phenobarbital -bosentan -carbamazepine -caffeine -clofibrate -cyclosporine -dantrolene -doxercalciferol -felbamate -grapefruit juice -hydrocortisone -medicines for anxiety or sleeping problems, such as diazepam or temazepam -medicines for diabetes, including pioglitazone -mineral oil -modafinil -mycophenolate -nefazodone -oxcarbazepine -phenytoin -prednisolone -ritonavir or other medicines for HIV infection or AIDS -rosuvastatin -selegiline -soy isoflavones supplements -St. John's wort -tamoxifen or raloxifene -theophylline -thyroid hormones -topiramate -warfarin This list may not describe all possible interactions. Give your health care  provider a list of all the medicines, herbs, non-prescription drugs, or dietary supplements you use. Also tell them if you smoke, drink alcohol, or use illegal drugs. Some   items may interact with your medicine. What should I watch for while using this medicine? Visit your doctor or health care professional for regular checks on your progress. You will need a regular breast and pelvic exam and Pap smear while on this medicine. Use an additional method of contraception during the first cycle that you take these tablets. If you have any reason to think you are pregnant, stop taking this medicine right away and contact your doctor or health care professional. If you are taking this medicine for hormone related problems, it may take several cycles of use to see improvement in your condition. Smoking increases the risk of getting a blood clot or having a stroke while you are taking birth control pills, especially if you are more than 23 years old. You are strongly advised not to smoke. This medicine can make your body retain fluid, making your fingers, hands, or ankles swell. Your blood pressure can go up. Contact your doctor or health care professional if you feel you are retaining fluid. This medicine can make you more sensitive to the sun. Keep out of the sun. If you cannot avoid being in the sun, wear protective clothing and use sunscreen. Do not use sun lamps or tanning beds/booths. If you wear contact lenses and notice visual changes, or if the lenses begin to feel uncomfortable, consult your eye care specialist. In some women, tenderness, swelling, or minor bleeding of the gums may occur. Notify your dentist if this happens. Brushing and flossing your teeth regularly may help limit this. See your dentist regularly and inform your dentist of the medicines you are taking. If you are going to have elective surgery, you may need to stop taking this medicine before the surgery. Consult your health care  professional for advice. This medicine does not protect you against HIV infection (AIDS) or any other sexually transmitted diseases. What side effects may I notice from receiving this medicine? Side effects that you should report to your doctor or health care professional as soon as possible: -allergic reactions like skin rash, itching or hives, swelling of the face, lips, or tongue -breast tissue changes or discharge -changes in vaginal bleeding during your period or between your periods -changes in vision -chest pain -confusion -coughing up blood -dizziness -feeling faint or lightheaded -headaches or migraines -leg, arm or groin pain -loss of balance or coordination -severe or sudden headaches -stomach pain (severe) -sudden shortness of breath -sudden numbness or weakness of the face, arm or leg -symptoms of vaginal infection like itching, irritation or unusual discharge -tenderness in the upper abdomen -trouble speaking or understanding -vomiting -yellowing of the eyes or skin Side effects that usually do not require medical attention (report to your doctor or health care professional if they continue or are bothersome): -breakthrough bleeding and spotting that continues beyond the 3 initial cycles of pills -breast tenderness -mood changes, anxiety, depression, frustration, anger, or emotional outbursts -increased sensitivity to sun or ultraviolet light -nausea -skin rash, acne, or brown spots on the skin -weight gain (slight) This list may not describe all possible side effects. Call your doctor for medical advice about side effects. You may report side effects to FDA at 1-800-FDA-1088. Where should I keep my medicine? Keep out of the reach of children. Store at room temperature between 15 and 30 degrees C (59 and 86 degrees F). Throw away any unused medicine after the expiration date. NOTE: This sheet is a summary. It may not cover all possible information. If you   have  questions about this medicine, talk to your doctor, pharmacist, or health care provider.  2018 Elsevier/Gold Standard (2016-02-12 08:04:41) Preventive Care 18-39 Years, Female Preventive care refers to lifestyle choices and visits with your health care provider that can promote health and wellness. What does preventive care include?  A yearly physical exam. This is also called an annual well check.  Dental exams once or twice a year.  Routine eye exams. Ask your health care provider how often you should have your eyes checked.  Personal lifestyle choices, including: ? Daily care of your teeth and gums. ? Regular physical activity. ? Eating a healthy diet. ? Avoiding tobacco and drug use. ? Limiting alcohol use. ? Practicing safe sex. ? Taking vitamin and mineral supplements as recommended by your health care provider. What happens during an annual well check? The services and screenings done by your health care provider during your annual well check will depend on your age, overall health, lifestyle risk factors, and family history of disease. Counseling Your health care provider may ask you questions about your:  Alcohol use.  Tobacco use.  Drug use.  Emotional well-being.  Home and relationship well-being.  Sexual activity.  Eating habits.  Work and work environment.  Method of birth control.  Menstrual cycle.  Pregnancy history.  Screening You may have the following tests or measurements:  Height, weight, and BMI.  Diabetes screening. This is done by checking your blood sugar (glucose) after you have not eaten for a while (fasting).  Blood pressure.  Lipid and cholesterol levels. These may be checked every 5 years starting at age 20.  Skin check.  Hepatitis C blood test.  Hepatitis B blood test.  Sexually transmitted disease (STD) testing.  BRCA-related cancer screening. This may be done if you have a family history of breast, ovarian, tubal, or  peritoneal cancers.  Pelvic exam and Pap test. This may be done every 3 years starting at age 21. Starting at age 30, this may be done every 5 years if you have a Pap test in combination with an HPV test.  Discuss your test results, treatment options, and if necessary, the need for more tests with your health care provider. Vaccines Your health care provider may recommend certain vaccines, such as:  Influenza vaccine. This is recommended every year.  Tetanus, diphtheria, and acellular pertussis (Tdap, Td) vaccine. You may need a Td booster every 10 years.  Varicella vaccine. You may need this if you have not been vaccinated.  HPV vaccine. If you are 26 or younger, you may need three doses over 6 months.  Measles, mumps, and rubella (MMR) vaccine. You may need at least one dose of MMR. You may also need a second dose.  Pneumococcal 13-valent conjugate (PCV13) vaccine. You may need this if you have certain conditions and were not previously vaccinated.  Pneumococcal polysaccharide (PPSV23) vaccine. You may need one or two doses if you smoke cigarettes or if you have certain conditions.  Meningococcal vaccine. One dose is recommended if you are age 19-21 years and a first-year college student living in a residence hall, or if you have one of several medical conditions. You may also need additional booster doses.  Hepatitis A vaccine. You may need this if you have certain conditions or if you travel or work in places where you may be exposed to hepatitis A.  Hepatitis B vaccine. You may need this if you have certain conditions or if you travel or work in   places where you may be exposed to hepatitis B.  Haemophilus influenzae type b (Hib) vaccine. You may need this if you have certain risk factors.  Talk to your health care provider about which screenings and vaccines you need and how often you need them. This information is not intended to replace advice given to you by your health care  provider. Make sure you discuss any questions you have with your health care provider. Document Released: 07/30/2001 Document Revised: 02/21/2016 Document Reviewed: 04/04/2015 Elsevier Interactive Patient Education  2018 Elsevier Inc.  

## 2018-01-26 NOTE — Progress Notes (Signed)
Pt presents today with no issues, only requestin renewal on birth control pills.

## 2019-02-24 ENCOUNTER — Telehealth: Payer: Self-pay | Admitting: Certified Nurse Midwife

## 2019-02-26 ENCOUNTER — Encounter: Payer: Self-pay | Admitting: Certified Nurse Midwife

## 2019-02-26 ENCOUNTER — Other Ambulatory Visit: Payer: Self-pay

## 2019-02-26 ENCOUNTER — Ambulatory Visit (INDEPENDENT_AMBULATORY_CARE_PROVIDER_SITE_OTHER): Payer: Medicaid Other | Admitting: Certified Nurse Midwife

## 2019-02-26 VITALS — BP 122/80 | HR 72 | Ht 67.0 in | Wt 225.1 lb

## 2019-02-26 DIAGNOSIS — Z3009 Encounter for other general counseling and advice on contraception: Secondary | ICD-10-CM

## 2019-02-26 DIAGNOSIS — Z0000 Encounter for general adult medical examination without abnormal findings: Secondary | ICD-10-CM

## 2019-02-26 DIAGNOSIS — Z01419 Encounter for gynecological examination (general) (routine) without abnormal findings: Secondary | ICD-10-CM

## 2019-02-26 DIAGNOSIS — Z6835 Body mass index (BMI) 35.0-35.9, adult: Secondary | ICD-10-CM

## 2019-02-26 MED ORDER — VITAMIN B-12 100 MCG PO TABS: 100.0000 ug | ORAL_TABLET | Freq: Every day | ORAL | 2 refills | Status: AC

## 2019-02-26 MED ORDER — PHENTERMINE HCL 37.5 MG PO CAPS: 37.5000 mg | ORAL_CAPSULE | ORAL | 2 refills | Status: AC

## 2019-02-26 NOTE — Patient Instructions (Signed)
Calorie Counting for Weight Loss Calories are units of energy. Your body needs a certain amount of calories from food to keep you going throughout the day. When you eat more calories than your body needs, your body stores the extra calories as fat. When you eat fewer calories than your body needs, your body [place] fat to get the energy it needs. Calorie counting means keeping track of how many calories you eat and drink each day. Calorie counting can be helpful if you need to lose weight. If you make sure to eat fewer calories than your body needs, you should lose weight. Ask your health care provider what a healthy weight is for you. For calorie counting to work, you will need to eat the right number of calories in a day in order to lose a healthy amount of weight per week. A dietitian can help you determine how many calories you need in a day and will give you suggestions on how to reach your calorie goal.  A healthy amount of weight to lose per week is usually 1-2 lb (0.5-0.9 kg). This usually means that your daily calorie intake should be reduced by 500-750 calories.  Eating 1,200 - 1,500 calories per day can help most women lose weight.  Eating 1,500 - 1,800 calories per day can help most men lose weight. What is my plan? My goal is to have __________ calories per day. If I have this many calories per day, I should lose around __________ pounds per week. What do I need to know about calorie counting? In order to meet your daily calorie goal, you will need to:  Find out how many calories are in each food you would like to eat. Try to do this before you eat.  Decide how much of the food you plan to eat.  Write down what you ate and how many calories it had. Doing this is called keeping a food log. To successfully lose weight, it is important to balance calorie counting with a healthy lifestyle that includes regular activity. Aim for 150 minutes of moderate exercise (such as walking) or 75  minutes of vigorous exercise (such as running) each week. Where do I find calorie information?  The number of calories in a food can be found on a Nutrition Facts label. If a food does not have a Nutrition Facts label, try to look up the calories online or ask your dietitian for help. Remember that calories are listed per serving. If you choose to have more than one serving of a food, you will have to multiply the calories per serving by the amount of servings you plan to eat. For example, the label on a package of bread might say that a serving size is 1 slice and that there are 90 calories in a serving. If you eat 1 slice, you will have eaten 90 calories. If you eat 2 slices, you will have eaten 180 calories. How do I keep a food log? Immediately after each meal, record the following information in your food log:  What you ate. Don't forget to include toppings, sauces, and other extras on the food.  How much you ate. This can be measured in cups, ounces, or number of items.  How many calories each food and drink had.  The total number of calories in the meal. Keep your food log near you, such as in a small notebook in your pocket, or use a mobile app or website. Some programs will calculate  calories for you and show you how many calories you have left for the day to meet your goal. °What are some calorie counting tips? ° °· Use your calories on foods and drinks that will fill you up and not leave you hungry: °? Some examples of foods that fill you up are nuts and nut butters, vegetables, lean proteins, and high-fiber foods like whole grains. High-fiber foods are foods with more than 5 g fiber per serving. °? Drinks such as sodas, specialty coffee drinks, alcohol, and juices have a lot of calories, yet do not fill you up. °· Eat nutritious foods and avoid empty calories. Empty calories are calories you get from foods or beverages that do not have many vitamins or protein, such as candy, sweets, and  soda. It is better to have a nutritious high-calorie food (such as an avocado) than a food with few nutrients (such as a bag of chips). °· Know how many calories are in the foods you eat most often. This will help you calculate calorie counts faster. °· Pay attention to calories in drinks. Low-calorie drinks include water and unsweetened drinks. °· Pay attention to nutrition labels for "low fat" or "fat free" foods. These foods sometimes have the same amount of calories or more calories than the full fat versions. They also often have added sugar, starch, or salt, to make up for flavor that was removed with the fat. °· Find a way of tracking calories that works for you. Get creative. Try different apps or programs if writing down calories does not work for you. °What are some portion control tips? °· Know how many calories are in a serving. This will help you know how many servings of a certain food you can have. °· Use a measuring cup to measure serving sizes. You could also try weighing out portions on a kitchen scale. With time, you will be able to estimate serving sizes for some foods. °· Take some time to put servings of different foods on your favorite plates, bowls, and cups so you know what a serving looks like. °· Try not to eat straight from a bag or box. Doing this can lead to overeating. Put the amount you would like to eat in a cup or on a plate to make sure you are eating the right portion. °· Use smaller plates, glasses, and bowls to prevent overeating. °· Try not to multitask (for example, watch TV or use your computer) while eating. If it is time to eat, sit down at a table and enjoy your food. This will help you to know when you are full. It will also help you to be aware of what you are eating and how much you are eating. °What are tips for following this plan? °Reading food labels °· Check the calorie count compared to the serving size. The serving size may be smaller than what you are used to  eating. °· Check the source of the calories. Make sure the food you are eating is high in vitamins and protein and low in saturated and trans fats. °Shopping °· Read nutrition labels while you shop. This will help you make healthy decisions before you decide to purchase your food. °· Make a grocery list and stick to it. °Cooking °· Try to cook your favorite foods in a healthier way. For example, try baking instead of frying. °· Use low-fat dairy products. °Meal planning °· Use more fruits and vegetables. Half of your plate should be fruits   and vegetables.  Include lean proteins like poultry and fish. How do I count calories when eating out?  Ask for smaller portion sizes.  Consider sharing an entree and sides instead of getting your own entree.  If you get your own entree, eat only half. Ask for a box at the beginning of your meal and put the rest of your entree in it so you are not tempted to eat it.  If calories are listed on the menu, choose the lower calorie options.  Choose dishes that include vegetables, fruits, whole grains, low-fat dairy products, and lean protein.  Choose items that are boiled, broiled, grilled, or steamed. Stay away from items that are buttered, battered, fried, or served with cream sauce. Items labeled "crispy" are usually fried, unless stated otherwise.  Choose water, low-fat milk, unsweetened iced tea, or other drinks without added sugar. If you want an alcoholic beverage, choose a lower calorie option such as a glass of wine or light beer.  Ask for dressings, sauces, and syrups on the side. These are usually high in calories, so you should limit the amount you eat.  If you want a salad, choose a garden salad and ask for grilled meats. Avoid extra toppings like bacon, cheese, or fried items. Ask for the dressing on the side, or ask for olive oil and vinegar or lemon to use as dressing.  Estimate how many servings of a food you are given. For example, a serving of  cooked rice is  cup or about the size of half a baseball. Knowing serving sizes will help you be aware of how much food you are eating at restaurants. The list below tells you how big or small some common portion sizes are based on everyday objects: ? 1 oz--4 stacked dice. ? 3 oz--1 deck of cards. ? 1 tsp--1 die. ? 1 Tbsp-- a ping-pong ball. ? 2 Tbsp--1 ping-pong ball. ?  cup-- baseball. ? 1 cup--1 baseball. Summary  Calorie counting means keeping track of how many calories you eat and drink each day. If you eat fewer calories than your body needs, you should lose weight.  A healthy amount of weight to lose per week is usually 1-2 lb (0.5-0.9 kg). This usually means reducing your daily calorie intake by 500-750 calories.  The number of calories in a food can be found on a Nutrition Facts label. If a food does not have a Nutrition Facts label, try to look up the calories online or ask your dietitian for help.  Use your calories on foods and drinks that will fill you up, and not on foods and drinks that will leave you hungry.  Use smaller plates, glasses, and bowls to prevent overeating. This information is not intended to replace advice given to you by your health care provider. Make sure you discuss any questions you have with your health care provider. Document Released: 06/03/2005 Document Revised: 02/20/2018 Document Reviewed: 05/03/2016 Elsevier Patient Education  [place]. Phentermine tablets or capsules What is this medicine? PHENTERMINE (FEN ter meen) decreases your appetite. It is used with a reduced calorie diet and exercise to help you lose weight. This medicine may be used for other purposes; ask your health care provider or pharmacist if you have questions. COMMON BRAND NAME(S): Adipex-P, Atti-Plex P, Atti-Plex P Spansule, Fastin, Lomaira, Pro-Fast, Tara-8 What should I tell my health care provider before I take this medicine? They need to know if you have any of  these conditions:  agitation  or nervousness  diabetes  glaucoma  heart disease  high blood pressure  history of drug abuse or addiction  history of stroke  kidney disease  lung disease called Primary Pulmonary Hypertension (PPH)  taken an MAOI like Carbex, Eldepryl, Marplan, Nardil, or Parnate in last 14 days  taking stimulant medicines for attention disorders, weight loss, or to stay awake  thyroid disease  an unusual or allergic reaction to phentermine, other medicines, foods, dyes, or preservatives  pregnant or trying to get pregnant  breast-feeding How should I use this medicine? Take this medicine by mouth with a glass of water. Follow the directions on the prescription label. The instructions for use may differ based on the product and dose you are taking. Avoid taking this medicine in the evening. It may interfere with sleep. Take your doses at regular intervals. Do not take your medicine more often than directed. Talk to your pediatrician regarding the use of this medicine in children. While this drug may be prescribed for children 17 years or older for selected conditions, precautions do apply. Overdosage: If you think you have taken too much of this medicine contact a poison control center or emergency room at once. NOTE: This medicine is only for you. Do not share this medicine with others. What if I miss a dose? If you miss a dose, take it as soon as you can. If it is almost time for your next dose, take only that dose. Do not take double or extra doses. What may interact with this medicine? Do not take this medicine with any of the following medications:  MAOIs like Carbex, Eldepryl, Marplan, Nardil, and Parnate  medicines for colds or breathing difficulties like pseudoephedrine or phenylephrine  procarbazine  sibutramine  stimulant medicines for attention disorders, weight loss, or to stay awake This medicine may also interact with the following  medications:  certain medicines for depression, anxiety, or psychotic disturbances  linezolid  medicines for diabetes  medicines for high blood pressure This list may not describe all possible interactions. Give your health care provider a list of all the medicines, herbs, non-prescription drugs, or dietary supplements you use. Also tell them if you smoke, drink alcohol, or use illegal drugs. Some items may interact with your medicine. What should I watch for while using this medicine? Notify your physician immediately if you become short of breath while doing your normal activities. Do not take this medicine within 6 hours of bedtime. It can keep you from getting to sleep. Avoid drinks that contain caffeine and try to stick to a regular bedtime every night. This medicine was intended to be used in addition to a healthy diet and exercise. The best results are achieved this way. This medicine is only indicated for short-term use. Eventually your weight loss may level out. At that point, the drug will only help you maintain your new weight. Do not increase or in any way change your dose without consulting your doctor. You may get drowsy or dizzy. Do not drive, use machinery, or do anything that needs mental alertness until you know how this medicine affects you. Do not stand or sit up quickly, especially if you are an older patient. This reduces the risk of dizzy or fainting spells. Alcohol may increase dizziness and drowsiness. Avoid alcoholic drinks. What side effects may I notice from receiving this medicine? Side effects that you should report to your doctor or health care professional as soon as possible:  allergic reactions like skin rash,  itching or hives, swelling of the face, lips, or tongue)  anxiety  breathing problems  changes in vision  chest pain or chest tightness  depressed mood or other mood changes  hallucinations, loss of contact with reality  fast, irregular  heartbeat  increased blood pressure  irritable  nervousness or restlessness  painful urination  palpitations  tremors  trouble sleeping  seizures  signs and symptoms of a stroke like changes in vision; confusion; trouble speaking or understanding; severe headaches; sudden numbness or weakness of the face, arm or leg; trouble walking; dizziness; loss of balance or coordination  unusually weak or tired  vomiting Side effects that usually do not require medical attention (report to your doctor or health care professional if they continue or are bothersome):  constipation or diarrhea  dry mouth  headache  nausea  stomach upset  sweating This list may not describe all possible side effects. Call your doctor for medical advice about side effects. You may report side effects to FDA at 1-800-FDA-1088. Where should I keep my medicine? Keep out of the reach of children. This medicine can be abused. Keep your medicine in a safe place to protect it from theft. Do not share this medicine with anyone. Selling or giving away this medicine is dangerous and against the law. This medicine may cause accidental overdose and death if taken by other adults, children, or pets. Mix any unused medicine with a substance like cat litter or coffee grounds. Then throw the medicine away in a sealed container like a sealed bag or a coffee can with a lid. Do not use the medicine after the expiration date. Store at room temperature between 20 and 25 degrees C (68 and 77 degrees F). Keep container tightly closed. NOTE: This sheet is a summary. It may not cover all possible information. If you have questions about this medicine, talk to your doctor, pharmacist, or health care provider.  2020 Elsevier/Gold Standard (2016-11-15 08:23:13) Drospirenone tablets (contraception) What is this medicine? DROSPIRENONE (dro SPY re nown) is an oral contraceptive (birth control pill). The product contains a female  hormone known as a progestin. It is used to prevent pregnancy. This medicine may be used for other purposes; ask your health care provider or pharmacist if you have questions. COMMON BRAND NAME(S): FeRiva 21/7, SLYND What should I tell my health care provider before I take this medicine? They need to know if you have any of these conditions:  abnormal vaginal bleeding  adrenal gland disease  blood vessel disease or blood clots  breast, cervical, endometrial, ovarian, liver, or uterine cancer  diabetes  heart disease or recent heart attack  high potassium level  kidney disease  liver disease  mental depression  migraine headaches  stroke  an unusual or allergic reaction to drospirenone, progestins, or other medicines, foods, dyes, or preservatives  pregnant or trying to get pregnant  breast-feeding How should I use this medicine? Take this medicine by mouth. To reduce nausea, this medicine may be taken with food. Follow the directions on the prescription label. Take this medicine at the same time each day and in the order directed on the package. Do not take your medicine more often than directed. A patient package insert for the product will be given with each prescription and refill. Read this sheet carefully each time. The sheet may change frequently. Talk to your pediatrician regarding the use of this medicine in children. Special care may be needed. This medicine has been used in  female children who have started having menstrual periods. Overdosage: If you think you have taken too much of this medicine contact a poison control center or emergency room at once. NOTE: This medicine is only for you. Do not share this medicine with others. What if I miss a dose? If you miss a dose, take it as soon as you can and refer to the patient information sheet you received with your medicine for direction. If you miss more than one pill, this medicine may not be as effective and you  may need to use another form of birth control. What may interact with this medicine? Do not take this medicine with any of the following medications:  atazanavir; cobicistat  bosentan  fosamprenavir This medicine may also interact with the following medications:  aprepitant  barbiturates like phenobarbital, primidone  carbamazepine  certain antibiotics like clarithromycin, rifampin, rifabutin, rifapentine  certain antivirals for HIV or hepatitis  certain diuretics like amiloride, spironolactone, triamterene  certain medicines for fungal infections like griseofulvin, ketoconazole, itraconazole, voriconazole  certain medicines for blood pressure, heart disease  cyclosporine  felbamate  heparin  medicines for diabetes  modafinil  NSAIDs, medicines for pain and inflammation, like ibuprofen or naproxen  oxcarbazepine  phenytoin  potassium supplements  rufinamide  St. John's wort  topiramate This list may not describe all possible interactions. Give your health care provider a list of all the medicines, herbs, non-prescription drugs, or dietary supplements you use. Also tell them if you smoke, drink alcohol, or use illegal drugs. Some items may interact with your medicine. What should I watch for while using this medicine? Visit your doctor or health care professional for regular checks on your progress. You will need a regular breast and pelvic exam and Pap smear while on this medicine. You may need blood work done while you are taking this medicine. If you have any reason to think you are pregnant, stop taking this medicine right away and contact your doctor or health care professional. This medicine does not protect you against HIV infection (AIDS) or any other sexually transmitted diseases. If you are going to have elective surgery, you may need to stop taking this medicine before the surgery. Consult your health care professional for advice. What side effects  may I notice from receiving this medicine? Side effects that you should report to your doctor or health care professional as soon as possible:  allergic reactions like skin rash, itching or hives, swelling of the face, lips, or tongue  breast tissue changes or discharge  depressed mood  severe pain, swelling, or tenderness in the abdomen  signs and symptoms of a blood clot such as chest pain; shortness of breath; pain, swelling, or warmth in the leg  signs and symptoms of increased potassium like muscle weakness; chest pain; or fast, irregular heartbeat  signs and symptoms of liver injury like dark yellow or brown urine; general ill feeling or flu-like symptoms; light-colored stools; loss of appetite; nausea; right upper belly pain; unusually weak or tired; yellowing of the eyes or skin  signs and symptoms of a stroke like changes in vision; confusion; trouble speaking or understanding; severe headaches; sudden numbness or weakness of the face, arm or leg; trouble walking; dizziness; loss of balance or coordination  unusual vaginal bleeding  unusually weak or tired Side effects that usually do not require medical attention (report these to your doctor or health care professional if they continue or are bothersome):  acne  breast tenderness  headache  menstrual cramps  nausea  weight gain This list may not describe all possible side effects. Call your doctor for medical advice about side effects. You may report side effects to FDA at 1-800-FDA-1088. Where should I keep my medicine? Keep out of the reach of children. Store at room temperature between 20 and 25 degrees C (68 and 77 degrees F). Throw away any unused medicine after the expiration date. NOTE: This sheet is a summary. It may not cover all possible information. If you have questions about this medicine, talk to your doctor, pharmacist, or health care provider.  2020 Elsevier/Gold Standard (2017-11-12 15:01:56)

## 2019-02-26 NOTE — Progress Notes (Signed)
ANNUAL PREVENTATIVE CARE GYN  ENCOUNTER NOTE  Subjective:       Linda Durham is a 23 y.o. G0P0000 female here for a routine annual gynecologic exam.  Current complaints: 1. Wishes to restart medical weight loss program 2. Wants to change OCP due to frequent headaches  Denies difficulty breathing or respiratory distress, chest pain, abdominal pain, excessive vaginal bleeding, dysuria, and leg pain or swelling.    Gynecologic History  Patient's last menstrual period was 01/24/2019.  Contraception: OCP (estrogen/progesterone), Loestrin  Last Pap: 2018. Results were: normal  Obstetric History  OB History  Gravida Para Term Preterm AB Living  0 0 0 0 0 0  SAB TAB Ectopic Multiple Live Births  0 0 0 0 0    History reviewed. No pertinent past medical history.  Past Surgical History:  Procedure Laterality Date  . TONSILLECTOMY      Current Outpatient Medications on File Prior to Visit  Medication Sig Dispense Refill  . norethindrone-ethinyl estradiol (JUNEL FE 1/20) 1-20 MG-MCG tablet Take 1 tablet by mouth daily. PT NEEDS AN APPOINTMENT BEFORE ANY MORE REFILLS GIVEN (Patient not taking: Reported on 02/26/2019) 3 Package 4   No current facility-administered medications on file prior to visit.     No Known Allergies  Social History   Socioeconomic History  . Marital status: Single    Spouse name: Not on file  . Number of children: Not on file  . Years of education: Not on file  . Highest education level: Not on file  Occupational History  . Not on file  Social Needs  . Financial resource strain: Not on file  . Food insecurity    Worry: Not on file    Inability: Not on file  . Transportation needs    Medical: Not on file    Non-medical: Not on file  Tobacco Use  . Smoking status: Never Smoker  . Smokeless tobacco: Never Used  Substance and Sexual Activity  . Alcohol use: Yes    Comment: Socially   . Drug use: No  . Sexual activity: Yes    Birth  control/protection: None, Condom  Lifestyle  . Physical activity    Days per week: Not on file    Minutes per session: Not on file  . Stress: Not on file  Relationships  . Social Herbalist on phone: Not on file    Gets together: Not on file    Attends religious service: Not on file    Active member of club or organization: Not on file    Attends meetings of clubs or organizations: Not on file    Relationship status: Not on file  . Intimate partner violence    Fear of current or ex partner: Not on file    Emotionally abused: Not on file    Physically abused: Not on file    Forced sexual activity: Not on file  Other Topics Concern  . Not on file  Social History Narrative  . Not on file    Family History  Problem Relation Age of Onset  . Diabetes Father   . Heart disease Father   . Breast cancer Neg Hx   . Migraines Neg Hx   . Rashes / Skin problems Neg Hx     The following portions of the patient's history were reviewed and updated as appropriate: allergies, current medications, past family history, past medical history, past social history, past surgical history and problem list.  Review of Systems  ROS negative except as noted above. Information obtained from patient.    Objective:   BP 122/80   Pulse 72   Ht 5\' 7"  (1.702 m)   Wt 225 lb 1.6 oz (102.1 kg)   LMP 01/24/2019   BMI 35.26 kg/m    CONSTITUTIONAL: Well-developed, well-nourished female in no acute distress.   PSYCHIATRIC: Normal mood and affect. Normal behavior. Normal judgment and thought content.  NEUROLGIC: Alert and oriented to person, place, and time. Normal muscle tone coordination. No cranial nerve deficit noted.  HENT:  Normocephalic, atraumatic, External right and left ear normal.   EYES: Conjunctivae and EOM are normal. Pupils are equal and round.   NECK: Normal range of motion, supple, no masses.  Normal thyroid.   SKIN: Skin is warm and dry. No rash noted. Not diaphoretic.  No erythema. No pallor.  CARDIOVASCULAR: Normal heart rate noted, regular rhythm, no murmur.  RESPIRATORY: Clear to auscultation bilaterally. Effort and breath sounds normal, no problems with respiration noted.  BREASTS: Symmetric in size. No masses, skin changes, nipple drainage, or lymphadenopathy.  ABDOMEN: Soft, normal bowel sounds, no distention noted.  No tenderness, rebound or guarding. Obese.   PELVIC: Declined by patient.   MUSCULOSKELETAL: Normal range of motion. No tenderness.  No cyanosis, clubbing, or edema.  2+ distal pulses.  LYMPHATIC: No Axillary, Supraclavicular, or Inguinal Adenopathy.  Assessment:   Annual gynecologic examination 23 y.o.   Contraception: oral progesterone-only contraceptive, Slynd   Obesity 2   Problem List Items Addressed This Visit    None    Visit Diagnoses    Well woman exam    -  Primary   BMI 35.0-35.9,adult       Encounter for counseling regarding contraception          Plan:   Pap: Not needed  Labs: Declined   Routine preventative health maintenance measures emphasized: Exercise/Diet/Weight control, Tobacco Warnings, Alcohol/Substance use risks, Stress Management, Peer Pressure Issues and Safe Sex; see AVS  Rx Phetermine and B-12, see orders  Samples of Slynd given, patient to contact if she wishes to remain on medication  Reviewed red flag symptoms and when to call  RTC x 1 month weight, blood pressure check and waist circumference   RTC x 1 year for ANNUAL EXAM and Pap or sooner if needed   Gunnar BullaJenkins Michelle Taitum Menton, CNM [facility], [facility] 02/26/19 4:21 PM

## 2019-03-26 ENCOUNTER — Other Ambulatory Visit: Payer: Self-pay

## 2019-03-26 ENCOUNTER — Encounter: Payer: Self-pay | Admitting: Certified Nurse Midwife

## 2019-03-26 ENCOUNTER — Ambulatory Visit (INDEPENDENT_AMBULATORY_CARE_PROVIDER_SITE_OTHER): Payer: Self-pay | Admitting: Certified Nurse Midwife

## 2019-03-26 VITALS — BP 126/84 | HR 87 | Ht 67.0 in | Wt 219.6 lb

## 2019-03-26 DIAGNOSIS — Z0130 Encounter for examination of blood pressure without abnormal findings: Secondary | ICD-10-CM

## 2019-03-26 DIAGNOSIS — R634 Abnormal weight loss: Secondary | ICD-10-CM

## 2019-03-26 DIAGNOSIS — Z713 Dietary counseling and surveillance: Secondary | ICD-10-CM

## 2019-03-26 DIAGNOSIS — Z6834 Body mass index (BMI) 34.0-34.9, adult: Secondary | ICD-10-CM

## 2019-03-26 NOTE — Progress Notes (Signed)
GYN ENCOUNTER NOTE  Subjective:       Linda Durham is a 23 y.o. G0P0000 female here for weight and blood pressure check. Taking phentermine daily for medical weight loss.   No adverse reactions. Denies headache, blurred vision, difficulty breathing or respiratory distress, chest pain, abdominal pain, excessive vaginal bleeding, dysuria, and leg pain or swelling.    Gynecologic History  Patient's last menstrual period was 03/02/2019.  Contraception: condoms and oral progesterone-only contraceptive, Slynd  Last Pap: 09/2016. Results were: normal  Obstetric History  OB History  Gravida Para Term Preterm AB Living  0 0 0 0 0 0  SAB TAB Ectopic Multiple Live Births  0 0 0 0 0    History reviewed. No pertinent past medical history.  Past Surgical History:  Procedure Laterality Date  . TONSILLECTOMY      Current Outpatient Medications on File Prior to Visit  Medication Sig Dispense Refill  . norethindrone-ethinyl estradiol (JUNEL FE 1/20) 1-20 MG-MCG tablet Take 1 tablet by mouth daily. PT NEEDS AN APPOINTMENT BEFORE ANY MORE REFILLS GIVEN 3 Package 4  . phentermine 37.5 MG capsule Take 1 capsule (37.5 mg total) by mouth every morning. 30 capsule 2  . vitamin B-12 (CYANOCOBALAMIN) 100 MCG tablet Take 1 tablet (100 mcg total) by mouth daily. 30 tablet 2   No current facility-administered medications on file prior to visit.     No Known Allergies  Social History   Socioeconomic History  . Marital status: Single    Spouse name: Not on file  . Number of children: Not on file  . Years of education: Not on file  . Highest education level: Not on file  Occupational History  . Not on file  Social Needs  . Financial resource strain: Not on file  . Food insecurity    Worry: Not on file    Inability: Not on file  . Transportation needs    Medical: Not on file    Non-medical: Not on file  Tobacco Use  . Smoking status: Never Smoker  . Smokeless tobacco: Never Used   Substance and Sexual Activity  . Alcohol use: Yes    Comment: Socially   . Drug use: No  . Sexual activity: Yes    Birth control/protection: None, Condom, Pill  Lifestyle  . Physical activity    Days per week: Not on file    Minutes per session: Not on file  . Stress: Not on file  Relationships  . Social Herbalist on phone: Not on file    Gets together: Not on file    Attends religious service: Not on file    Active member of club or organization: Not on file    Attends meetings of clubs or organizations: Not on file    Relationship status: Not on file  . Intimate partner violence    Fear of current or ex partner: Not on file    Emotionally abused: Not on file    Physically abused: Not on file    Forced sexual activity: Not on file  Other Topics Concern  . Not on file  Social History Narrative  . Not on file    Family History  Problem Relation Age of Onset  . Diabetes Father   . Heart disease Father   . Breast cancer Neg Hx   . Migraines Neg Hx   . Rashes / Skin problems Neg Hx     The following portions of the  patient's history were reviewed and updated as appropriate: allergies, current medications, past family history, past medical history, past social history, past surgical history and problem list.  Review of Systems  ROS negative except as noted above. Information obtained from patient.   Objective:   BP 126/84   Pulse 87   Ht 5\' 7"  (1.702 m)   Wt 219 lb 9.6 oz (99.6 kg)   LMP 03/02/2019   BMI 34.39 kg/m    CONSTITUTIONAL: Well-developed, well-nourished female in no acute distress.   ABDOMEN: Waist circumference greater than 36 inches.  MUSCULOSKELETAL: Normal range of motion. No tenderness.  No cyanosis, clubbing, or edema.  Assessment:   1. Weight loss due to medication   2. Blood pressure check   3. BMI 34.0-34.9,adult  Plan:   Praise given for six (6) pound weight loss.   Continue medication as prescribed.   Reviewed  red flag symptoms and when to call.   RTC x 1 months for blood pressure check and weight or sooner if needed.    07-06-1975, CNM [facility], [facility]

## 2019-03-26 NOTE — Progress Notes (Signed)
Pt is present for weight management. Pt stated not having any problems with taking the medication phentermine as prescribed and denies any side effects from the medication.

## 2019-03-26 NOTE — Patient Instructions (Signed)
Calorie Counting for Weight Loss Calories are units of energy. Your body needs a certain amount of calories from food to keep you going throughout the day. When you eat more calories than your body needs, your body stores the extra calories as fat. When you eat fewer calories than your body needs, your body [place] fat to get the energy it needs. Calorie counting means keeping track of how many calories you eat and drink each day. Calorie counting can be helpful if you need to lose weight. If you make sure to eat fewer calories than your body needs, you should lose weight. Ask your health care provider what a healthy weight is for you. For calorie counting to work, you will need to eat the right number of calories in a day in order to lose a healthy amount of weight per week. A dietitian can help you determine how many calories you need in a day and will give you suggestions on how to reach your calorie goal.  A healthy amount of weight to lose per week is usually 1-2 lb (0.5-0.9 kg). This usually means that your daily calorie intake should be reduced by 500-750 calories.  Eating 1,200 - 1,500 calories per day can help most women lose weight.  Eating 1,500 - 1,800 calories per day can help most men lose weight. What is my plan? My goal is to have __________ calories per day. If I have this many calories per day, I should lose around __________ pounds per week. What do I need to know about calorie counting? In order to meet your daily calorie goal, you will need to:  Find out how many calories are in each food you would like to eat. Try to do this before you eat.  Decide how much of the food you plan to eat.  Write down what you ate and how many calories it had. Doing this is called keeping a food log. To successfully lose weight, it is important to balance calorie counting with a healthy lifestyle that includes regular activity. Aim for 150 minutes of moderate exercise (such as walking) or 75  minutes of vigorous exercise (such as running) each week. Where do I find calorie information?  The number of calories in a food can be found on a Nutrition Facts label. If a food does not have a Nutrition Facts label, try to look up the calories online or ask your dietitian for help. Remember that calories are listed per serving. If you choose to have more than one serving of a food, you will have to multiply the calories per serving by the amount of servings you plan to eat. For example, the label on a package of bread might say that a serving size is 1 slice and that there are 90 calories in a serving. If you eat 1 slice, you will have eaten 90 calories. If you eat 2 slices, you will have eaten 180 calories. How do I keep a food log? Immediately after each meal, record the following information in your food log:  What you ate. Don't forget to include toppings, sauces, and other extras on the food.  How much you ate. This can be measured in cups, ounces, or number of items.  How many calories each food and drink had.  The total number of calories in the meal. Keep your food log near you, such as in a small notebook in your pocket, or use a mobile app or website. Some programs will calculate  calories for you and show you how many calories you have left for the day to meet your goal. °What are some calorie counting tips? ° °· Use your calories on foods and drinks that will fill you up and not leave you hungry: °? Some examples of foods that fill you up are nuts and nut butters, vegetables, lean proteins, and high-fiber foods like whole grains. High-fiber foods are foods with more than 5 g fiber per serving. °? Drinks such as sodas, specialty coffee drinks, alcohol, and juices have a lot of calories, yet do not fill you up. °· Eat nutritious foods and avoid empty calories. Empty calories are calories you get from foods or beverages that do not have many vitamins or protein, such as candy, sweets, and  soda. It is better to have a nutritious high-calorie food (such as an avocado) than a food with few nutrients (such as a bag of chips). °· Know how many calories are in the foods you eat most often. This will help you calculate calorie counts faster. °· Pay attention to calories in drinks. Low-calorie drinks include water and unsweetened drinks. °· Pay attention to nutrition labels for "low fat" or "fat free" foods. These foods sometimes have the same amount of calories or more calories than the full fat versions. They also often have added sugar, starch, or salt, to make up for flavor that was removed with the fat. °· Find a way of tracking calories that works for you. Get creative. Try different apps or programs if writing down calories does not work for you. °What are some portion control tips? °· Know how many calories are in a serving. This will help you know how many servings of a certain food you can have. °· Use a measuring cup to measure serving sizes. You could also try weighing out portions on a kitchen scale. With time, you will be able to estimate serving sizes for some foods. °· Take some time to put servings of different foods on your favorite plates, bowls, and cups so you know what a serving looks like. °· Try not to eat straight from a bag or box. Doing this can lead to overeating. Put the amount you would like to eat in a cup or on a plate to make sure you are eating the right portion. °· Use smaller plates, glasses, and bowls to prevent overeating. °· Try not to multitask (for example, watch TV or use your computer) while eating. If it is time to eat, sit down at a table and enjoy your food. This will help you to know when you are full. It will also help you to be aware of what you are eating and how much you are eating. °What are tips for following this plan? °Reading food labels °· Check the calorie count compared to the serving size. The serving size may be smaller than what you are used to  eating. °· Check the source of the calories. Make sure the food you are eating is high in vitamins and protein and low in saturated and trans fats. °Shopping °· Read nutrition labels while you shop. This will help you make healthy decisions before you decide to purchase your food. °· Make a grocery list and stick to it. °Cooking °· Try to cook your favorite foods in a healthier way. For example, try baking instead of frying. °· Use low-fat dairy products. °Meal planning °· Use more fruits and vegetables. Half of your plate should be fruits   and vegetables.  Include lean proteins like poultry and fish. How do I count calories when eating out?  Ask for smaller portion sizes.  Consider sharing an entree and sides instead of getting your own entree.  If you get your own entree, eat only half. Ask for a box at the beginning of your meal and put the rest of your entree in it so you are not tempted to eat it.  If calories are listed on the menu, choose the lower calorie options.  Choose dishes that include vegetables, fruits, whole grains, low-fat dairy products, and lean protein.  Choose items that are boiled, broiled, grilled, or steamed. Stay away from items that are buttered, battered, fried, or served with cream sauce. Items labeled "crispy" are usually fried, unless stated otherwise.  Choose water, low-fat milk, unsweetened iced tea, or other drinks without added sugar. If you want an alcoholic beverage, choose a lower calorie option such as a glass of wine or light beer.  Ask for dressings, sauces, and syrups on the side. These are usually high in calories, so you should limit the amount you eat.  If you want a salad, choose a garden salad and ask for grilled meats. Avoid extra toppings like bacon, cheese, or fried items. Ask for the dressing on the side, or ask for olive oil and vinegar or lemon to use as dressing.  Estimate how many servings of a food you are given. For example, a serving of  cooked rice is  cup or about the size of half a baseball. Knowing serving sizes will help you be aware of how much food you are eating at restaurants. The list below tells you how big or small some common portion sizes are based on everyday objects: ? 1 oz--4 stacked dice. ? 3 oz--1 deck of cards. ? 1 tsp--1 die. ? 1 Tbsp-- a ping-pong ball. ? 2 Tbsp--1 ping-pong ball. ?  cup-- baseball. ? 1 cup--1 baseball. Summary  Calorie counting means keeping track of how many calories you eat and drink each day. If you eat fewer calories than your body needs, you should lose weight.  A healthy amount of weight to lose per week is usually 1-2 lb (0.5-0.9 kg). This usually means reducing your daily calorie intake by 500-750 calories.  The number of calories in a food can be found on a Nutrition Facts label. If a food does not have a Nutrition Facts label, try to look up the calories online or ask your dietitian for help.  Use your calories on foods and drinks that will fill you up, and not on foods and drinks that will leave you hungry.  Use smaller plates, glasses, and bowls to prevent overeating. This information is not intended to replace advice given to you by your health care provider. Make sure you discuss any questions you have with your health care provider. Document Released: 06/03/2005 Document Revised: 02/20/2018 Document Reviewed: 05/03/2016 Elsevier Patient Education  [place]. Phentermine tablets or capsules What is this medicine? PHENTERMINE (FEN ter meen) decreases your appetite. It is used with a reduced calorie diet and exercise to help you lose weight. This medicine may be used for other purposes; ask your health care provider or pharmacist if you have questions. COMMON BRAND NAME(S): Adipex-P, Atti-Plex P, Atti-Plex P Spansule, Fastin, Lomaira, Pro-Fast, Tara-8 What should I tell my health care provider before I take this medicine? They need to know if you have any of  these conditions:  agitation  or nervousness  diabetes  glaucoma  heart disease  high blood pressure  history of drug abuse or addiction  history of stroke  kidney disease  lung disease called Primary Pulmonary Hypertension (PPH)  taken an MAOI like Carbex, Eldepryl, Marplan, Nardil, or Parnate in last 14 days  taking stimulant medicines for attention disorders, weight loss, or to stay awake  thyroid disease  an unusual or allergic reaction to phentermine, other medicines, foods, dyes, or preservatives  pregnant or trying to get pregnant  breast-feeding How should I use this medicine? Take this medicine by mouth with a glass of water. Follow the directions on the prescription label. The instructions for use may differ based on the product and dose you are taking. Avoid taking this medicine in the evening. It may interfere with sleep. Take your doses at regular intervals. Do not take your medicine more often than directed. Talk to your pediatrician regarding the use of this medicine in children. While this drug may be prescribed for children 17 years or older for selected conditions, precautions do apply. Overdosage: If you think you have taken too much of this medicine contact a poison control center or emergency room at once. NOTE: This medicine is only for you. Do not share this medicine with others. What if I miss a dose? If you miss a dose, take it as soon as you can. If it is almost time for your next dose, take only that dose. Do not take double or extra doses. What may interact with this medicine? Do not take this medicine with any of the following medications:  MAOIs like Carbex, Eldepryl, Marplan, Nardil, and Parnate  medicines for colds or breathing difficulties like pseudoephedrine or phenylephrine  procarbazine  sibutramine  stimulant medicines for attention disorders, weight loss, or to stay awake This medicine may also interact with the following  medications:  certain medicines for depression, anxiety, or psychotic disturbances  linezolid  medicines for diabetes  medicines for high blood pressure This list may not describe all possible interactions. Give your health care provider a list of all the medicines, herbs, non-prescription drugs, or dietary supplements you use. Also tell them if you smoke, drink alcohol, or use illegal drugs. Some items may interact with your medicine. What should I watch for while using this medicine? Notify your physician immediately if you become short of breath while doing your normal activities. Do not take this medicine within 6 hours of bedtime. It can keep you from getting to sleep. Avoid drinks that contain caffeine and try to stick to a regular bedtime every night. This medicine was intended to be used in addition to a healthy diet and exercise. The best results are achieved this way. This medicine is only indicated for short-term use. Eventually your weight loss may level out. At that point, the drug will only help you maintain your new weight. Do not increase or in any way change your dose without consulting your doctor. You may get drowsy or dizzy. Do not drive, use machinery, or do anything that needs mental alertness until you know how this medicine affects you. Do not stand or sit up quickly, especially if you are an older patient. This reduces the risk of dizzy or fainting spells. Alcohol may increase dizziness and drowsiness. Avoid alcoholic drinks. What side effects may I notice from receiving this medicine? Side effects that you should report to your doctor or health care professional as soon as possible:  allergic reactions like skin rash,  itching or hives, swelling of the face, lips, or tongue) °· anxiety °· breathing problems °· changes in vision °· chest pain or chest tightness °· depressed mood or other mood changes °· hallucinations, loss of contact with reality °· fast, irregular  heartbeat °· increased blood pressure °· irritable °· nervousness or restlessness °· painful urination °· palpitations °· tremors °· trouble sleeping °· seizures °· signs and symptoms of a stroke like changes in vision; confusion; trouble speaking or understanding; severe headaches; sudden numbness or weakness of the face, arm or leg; trouble walking; dizziness; loss of balance or coordination °· unusually weak or tired °· vomiting °Side effects that usually do not require medical attention (report to your doctor or health care professional if they continue or are bothersome): °· constipation or diarrhea °· dry mouth °· headache °· nausea °· stomach upset °· sweating °This list may not describe all possible side effects. Call your doctor for medical advice about side effects. You may report side effects to FDA at 1-800-FDA-1088. °Where should I keep my medicine? °Keep out of the reach of children. This medicine can be abused. Keep your medicine in a safe place to protect it from theft. Do not share this medicine with anyone. Selling or giving away this medicine is dangerous and against the law. °This medicine may cause accidental overdose and death if taken by other adults, children, or pets. Mix any unused medicine with a substance like cat litter or coffee grounds. Then throw the medicine away in a sealed container like a sealed bag or a coffee can with a lid. Do not use the medicine after the expiration date. °Store at room temperature between 20 and 25 degrees C (68 and 77 degrees F). Keep container tightly closed. °NOTE: This sheet is a summary. It may not cover all possible information. If you have questions about this medicine, talk to your doctor, pharmacist, or health care provider. °© 2020 Elsevier/[employer] (2016-11-15 08:23:13) ° °

## 2019-04-23 ENCOUNTER — Other Ambulatory Visit: Payer: Self-pay

## 2019-04-23 ENCOUNTER — Encounter: Payer: Self-pay | Admitting: Certified Nurse Midwife

## 2019-04-23 ENCOUNTER — Ambulatory Visit (INDEPENDENT_AMBULATORY_CARE_PROVIDER_SITE_OTHER): Payer: Self-pay | Admitting: Certified Nurse Midwife

## 2019-04-23 VITALS — BP 115/81 | HR 78 | Ht 67.0 in | Wt 219.3 lb

## 2019-04-23 DIAGNOSIS — Z0130 Encounter for examination of blood pressure without abnormal findings: Secondary | ICD-10-CM

## 2019-04-23 DIAGNOSIS — Z6834 Body mass index (BMI) 34.0-34.9, adult: Secondary | ICD-10-CM

## 2019-04-23 DIAGNOSIS — Z3041 Encounter for surveillance of contraceptive pills: Secondary | ICD-10-CM

## 2019-04-23 MED ORDER — SLYND 4 MG PO TABS: 1.0000 | ORAL_TABLET | Freq: Every day | ORAL | 12 refills | Status: AC

## 2019-04-23 NOTE — Progress Notes (Signed)
Patient here for weight management.   Linda Durham presents for weight and B/P check.  No side effects of medication-Phentermine. Weight loss 0 lbs. Encouraged eating healthy and exercise.   Waist 39.5 inches.

## 2019-04-23 NOTE — Patient Instructions (Signed)
Drospirenone tablets (contraception) What is this medicine? DROSPIRENONE (dro SPY re nown) is an oral contraceptive (birth control pill). The product contains a female hormone known as a progestin. It is used to prevent pregnancy. This medicine may be used for other purposes; ask your health care provider or pharmacist if you have questions. COMMON BRAND NAME(S): FeRiva 21/7, SLYND What should I tell my health care provider before I take this medicine? They need to know if you have any of these conditions:  abnormal vaginal bleeding  adrenal gland disease  blood vessel disease or blood clots  breast, cervical, endometrial, ovarian, liver, or uterine cancer  diabetes  heart disease or recent heart attack  high potassium level  kidney disease  liver disease  mental depression  migraine headaches  stroke  an unusual or allergic reaction to drospirenone, progestins, or other medicines, foods, dyes, or preservatives  pregnant or trying to get pregnant  breast-feeding How should I use this medicine? Take this medicine by mouth. To reduce nausea, this medicine may be taken with food. Follow the directions on the prescription label. Take this medicine at the same time each day and in the order directed on the package. Do not take your medicine more often than directed. A patient package insert for the product will be given with each prescription and refill. Read this sheet carefully each time. The sheet may change frequently. Talk to your pediatrician regarding the use of this medicine in children. Special care may be needed. This medicine has been used in female children who have started having menstrual periods. Overdosage: If you think you have taken too much of this medicine contact a poison control center or emergency room at once. NOTE: This medicine is only for you. Do not share this medicine with others. What if I miss a dose? If you miss a dose, take it as soon as you can  and refer to the patient information sheet you received with your medicine for direction. If you miss more than one pill, this medicine may not be as effective and you may need to use another form of birth control. What may interact with this medicine? Do not take this medicine with any of the following medications:  atazanavir; cobicistat  bosentan  fosamprenavir This medicine may also interact with the following medications:  aprepitant  barbiturates like phenobarbital, primidone  carbamazepine  certain antibiotics like clarithromycin, rifampin, rifabutin, rifapentine  certain antivirals for HIV or hepatitis  certain diuretics like amiloride, spironolactone, triamterene  certain medicines for fungal infections like griseofulvin, ketoconazole, itraconazole, voriconazole  certain medicines for blood pressure, heart disease  cyclosporine  felbamate  heparin  medicines for diabetes  modafinil  NSAIDs, medicines for pain and inflammation, like ibuprofen or naproxen  oxcarbazepine  phenytoin  potassium supplements  rufinamide  St. John's wort  topiramate This list may not describe all possible interactions. Give your health care provider a list of all the medicines, herbs, non-prescription drugs, or dietary supplements you use. Also tell them if you smoke, drink alcohol, or use illegal drugs. Some items may interact with your medicine. What should I watch for while using this medicine? Visit your doctor or health care professional for regular checks on your progress. You will need a regular breast and pelvic exam and Pap smear while on this medicine. You may need blood work done while you are taking this medicine. If you have any reason to think you are pregnant, stop taking this medicine right away and contact  your doctor or health care professional. This medicine does not protect you against HIV infection (AIDS) or any other sexually transmitted diseases. If you  are going to have elective surgery, you may need to stop taking this medicine before the surgery. Consult your health care professional for advice. What side effects may I notice from receiving this medicine? Side effects that you should report to your doctor or health care professional as soon as possible:  allergic reactions like skin rash, itching or hives, swelling of the face, lips, or tongue  breast tissue changes or discharge  depressed mood  severe pain, swelling, or tenderness in the abdomen  signs and symptoms of a blood clot such as chest pain; shortness of breath; pain, swelling, or warmth in the leg  signs and symptoms of increased potassium like muscle weakness; chest pain; or fast, irregular heartbeat  signs and symptoms of liver injury like dark yellow or brown urine; general ill feeling or flu-like symptoms; light-colored stools; loss of appetite; nausea; right upper belly pain; unusually weak or tired; yellowing of the eyes or skin  signs and symptoms of a stroke like changes in vision; confusion; trouble speaking or understanding; severe headaches; sudden numbness or weakness of the face, arm or leg; trouble walking; dizziness; loss of balance or coordination  unusual vaginal bleeding  unusually weak or tired Side effects that usually do not require medical attention (report these to your doctor or health care professional if they continue or are bothersome):  acne  breast tenderness  headache  menstrual cramps  nausea  weight gain This list may not describe all possible side effects. Call your doctor for medical advice about side effects. You may report side effects to FDA at 1-800-FDA-1088. Where should I keep my medicine? Keep out of the reach of children. Store at room temperature between 20 and 25 degrees C (68 and 77 degrees F). Throw away any unused medicine after the expiration date. NOTE: This sheet is a summary. It may not cover all possible  information. If you have questions about this medicine, talk to your doctor, pharmacist, or health care provider.  2020 Elsevier/Gold Standard (2017-11-12 15:01:56) Phentermine tablets or capsules What is this medicine? PHENTERMINE (FEN ter meen) decreases your appetite. It is used with a reduced calorie diet and exercise to help you lose weight. This medicine may be used for other purposes; ask your health care provider or pharmacist if you have questions. COMMON BRAND NAME(S): Adipex-P, Atti-Plex P, Atti-Plex P Spansule, Fastin, Lomaira, Pro-Fast, Tara-8 What should I tell my health care provider before I take this medicine? They need to know if you have any of these conditions:  agitation or nervousness  diabetes  glaucoma  heart disease  high blood pressure  history of drug abuse or addiction  history of stroke  kidney disease  lung disease called Primary Pulmonary Hypertension (PPH)  taken an MAOI like Carbex, Eldepryl, Marplan, Nardil, or Parnate in last 14 days  taking stimulant medicines for attention disorders, weight loss, or to stay awake  thyroid disease  an unusual or allergic reaction to phentermine, other medicines, foods, dyes, or preservatives  pregnant or trying to get pregnant  breast-feeding How should I use this medicine? Take this medicine by mouth with a glass of water. Follow the directions on the prescription label. The instructions for use may differ based on the product and dose you are taking. Avoid taking this medicine in the evening. It may interfere with sleep. Take your  doses at regular intervals. Do not take your medicine more often than directed. Talk to your pediatrician regarding the use of this medicine in children. While this drug may be prescribed for children 17 years or older for selected conditions, precautions do apply. Overdosage: If you think you have taken too much of this medicine contact a poison control center or emergency  room at once. NOTE: This medicine is only for you. Do not share this medicine with others. What if I miss a dose? If you miss a dose, take it as soon as you can. If it is almost time for your next dose, take only that dose. Do not take double or extra doses. What may interact with this medicine? Do not take this medicine with any of the following medications:  MAOIs like Carbex, Eldepryl, Marplan, Nardil, and Parnate  medicines for colds or breathing difficulties like pseudoephedrine or phenylephrine  procarbazine  sibutramine  stimulant medicines for attention disorders, weight loss, or to stay awake This medicine may also interact with the following medications:  certain medicines for depression, anxiety, or psychotic disturbances  linezolid  medicines for diabetes  medicines for high blood pressure This list may not describe all possible interactions. Give your health care provider a list of all the medicines, herbs, non-prescription drugs, or dietary supplements you use. Also tell them if you smoke, drink alcohol, or use illegal drugs. Some items may interact with your medicine. What should I watch for while using this medicine? Notify your physician immediately if you become short of breath while doing your normal activities. Do not take this medicine within 6 hours of bedtime. It can keep you from getting to sleep. Avoid drinks that contain caffeine and try to stick to a regular bedtime every night. This medicine was intended to be used in addition to a healthy diet and exercise. The best results are achieved this way. This medicine is only indicated for short-term use. Eventually your weight loss may level out. At that point, the drug will only help you maintain your new weight. Do not increase or in any way change your dose without consulting your doctor. You may get drowsy or dizzy. Do not drive, use machinery, or do anything that needs mental alertness until you know how this  medicine affects you. Do not stand or sit up quickly, especially if you are an older patient. This reduces the risk of dizzy or fainting spells. Alcohol may increase dizziness and drowsiness. Avoid alcoholic drinks. What side effects may I notice from receiving this medicine? Side effects that you should report to your doctor or health care professional as soon as possible:  allergic reactions like skin rash, itching or hives, swelling of the face, lips, or tongue)  anxiety  breathing problems  changes in vision  chest pain or chest tightness  depressed mood or other mood changes  hallucinations, loss of contact with reality  fast, irregular heartbeat  increased blood pressure  irritable  nervousness or restlessness  painful urination  palpitations  tremors  trouble sleeping  seizures  signs and symptoms of a stroke like changes in vision; confusion; trouble speaking or understanding; severe headaches; sudden numbness or weakness of the face, arm or leg; trouble walking; dizziness; loss of balance or coordination  unusually weak or tired  vomiting Side effects that usually do not require medical attention (report to your doctor or health care professional if they continue or are bothersome):  constipation or diarrhea  dry mouth  headache  nausea  stomach upset  sweating This list may not describe all possible side effects. Call your doctor for medical advice about side effects. You may report side effects to FDA at 1-800-FDA-1088. Where should I keep my medicine? Keep out of the reach of children. This medicine can be abused. Keep your medicine in a safe place to protect it from theft. Do not share this medicine with anyone. Selling or giving away this medicine is dangerous and against the law. This medicine may cause accidental overdose and death if taken by other adults, children, or pets. Mix any unused medicine with a substance like cat litter or coffee  grounds. Then throw the medicine away in a sealed container like a sealed bag or a coffee can with a lid. Do not use the medicine after the expiration date. Store at room temperature between 20 and 25 degrees C (68 and 77 degrees F). Keep container tightly closed. NOTE: This sheet is a summary. It may not cover all possible information. If you have questions about this medicine, talk to your doctor, pharmacist, or health care provider.  2020 Elsevier/Gold Standard (2016-11-15 08:23:13) Calorie Counting for Weight Loss Calories are units of energy. Your body needs a certain amount of calories from food to keep you going throughout the day. When you eat more calories than your body needs, your body stores the extra calories as fat. When you eat fewer calories than your body needs, your body [place] fat to get the energy it needs. Calorie counting means keeping track of how many calories you eat and drink each day. Calorie counting can be helpful if you need to lose weight. If you make sure to eat fewer calories than your body needs, you should lose weight. Ask your health care provider what a healthy weight is for you. For calorie counting to work, you will need to eat the right number of calories in a day in order to lose a healthy amount of weight per week. A dietitian can help you determine how many calories you need in a day and will give you suggestions on how to reach your calorie goal.  A healthy amount of weight to lose per week is usually 1-2 lb (0.5-0.9 kg). This usually means that your daily calorie intake should be reduced by 500-750 calories.  Eating 1,200 - 1,500 calories per day can help most women lose weight.  Eating 1,500 - 1,800 calories per day can help most men lose weight. What is my plan? My goal is to have __________ calories per day. If I have this many calories per day, I should lose around __________ pounds per week. What do I need to know about calorie counting? In order to  meet your daily calorie goal, you will need to:  Find out how many calories are in each food you would like to eat. Try to do this before you eat.  Decide how much of the food you plan to eat.  Write down what you ate and how many calories it had. Doing this is called keeping a food log. To successfully lose weight, it is important to balance calorie counting with a healthy lifestyle that includes regular activity. Aim for 150 minutes of moderate exercise (such as walking) or 75 minutes of vigorous exercise (such as running) each week. Where do I find calorie information?  The number of calories in a food can be found on a Nutrition Facts label. If a food does not have a Nutrition Facts  label, try to look up the calories online or ask your dietitian for help. Remember that calories are listed per serving. If you choose to have more than one serving of a food, you will have to multiply the calories per serving by the amount of servings you plan to eat. For example, the label on a package of bread might say that a serving size is 1 slice and that there are 90 calories in a serving. If you eat 1 slice, you will have eaten 90 calories. If you eat 2 slices, you will have eaten 180 calories. How do I keep a food log? Immediately after each meal, record the following information in your food log:  What you ate. Don't forget to include toppings, sauces, and other extras on the food.  How much you ate. This can be measured in cups, ounces, or number of items.  How many calories each food and drink had.  The total number of calories in the meal. Keep your food log near you, such as in a small notebook in your pocket, or use a mobile app or website. Some programs will calculate calories for you and show you how many calories you have left for the day to meet your goal. What are some calorie counting tips?   Use your calories on foods and drinks that will fill you up and not leave you hungry: ? Some  examples of foods that fill you up are nuts and nut butters, vegetables, lean proteins, and high-fiber foods like whole grains. High-fiber foods are foods with more than 5 g fiber per serving. ? Drinks such as sodas, specialty coffee drinks, alcohol, and juices have a lot of calories, yet do not fill you up.  Eat nutritious foods and avoid empty calories. Empty calories are calories you get from foods or beverages that do not have many vitamins or protein, such as candy, sweets, and soda. It is better to have a nutritious high-calorie food (such as an avocado) than a food with few nutrients (such as a bag of chips).  Know how many calories are in the foods you eat most often. This will help you calculate calorie counts faster.  Pay attention to calories in drinks. Low-calorie drinks include water and unsweetened drinks.  Pay attention to nutrition labels for "low fat" or "fat free" foods. These foods sometimes have the same amount of calories or more calories than the full fat versions. They also often have added sugar, starch, or salt, to make up for flavor that was removed with the fat.  Find a way of tracking calories that works for you. Get creative. Try different apps or programs if writing down calories does not work for you. What are some portion control tips?  Know how many calories are in a serving. This will help you know how many servings of a certain food you can have.  Use a measuring cup to measure serving sizes. You could also try weighing out portions on a kitchen scale. With time, you will be able to estimate serving sizes for some foods.  Take some time to put servings of different foods on your favorite plates, bowls, and cups so you know what a serving looks like.  Try not to eat straight from a bag or box. Doing this can lead to overeating. Put the amount you would like to eat in a cup or on a plate to make sure you are eating the right portion.  Use smaller plates,  glasses, and bowls to prevent overeating.  Try not to multitask (for example, watch TV or use your computer) while eating. If it is time to eat, sit down at a table and enjoy your food. This will help you to know when you are full. It will also help you to be aware of what you are eating and how much you are eating. What are tips for following this plan? Reading food labels  Check the calorie count compared to the serving size. The serving size may be smaller than what you are used to eating.  Check the source of the calories. Make sure the food you are eating is high in vitamins and protein and low in saturated and trans fats. Shopping  Read nutrition labels while you shop. This will help you make healthy decisions before you decide to purchase your food.  Make a grocery list and stick to it. Cooking  Try to cook your favorite foods in a healthier way. For example, try baking instead of frying.  Use low-fat dairy products. Meal planning  Use more fruits and vegetables. Half of your plate should be fruits and vegetables.  Include lean proteins like poultry and fish. How do I count calories when eating out?  Ask for smaller portion sizes.  Consider sharing an entree and sides instead of getting your own entree.  If you get your own entree, eat only half. Ask for a box at the beginning of your meal and put the rest of your entree in it so you are not tempted to eat it.  If calories are listed on the menu, choose the lower calorie options.  Choose dishes that include vegetables, fruits, whole grains, low-fat dairy products, and lean protein.  Choose items that are boiled, broiled, grilled, or steamed. Stay away from items that are buttered, battered, fried, or served with cream sauce. Items labeled "crispy" are usually fried, unless stated otherwise.  Choose water, low-fat milk, unsweetened iced tea, or other drinks without added sugar. If you want an alcoholic beverage, choose a  lower calorie option such as a glass of wine or light beer.  Ask for dressings, sauces, and syrups on the side. These are usually high in calories, so you should limit the amount you eat.  If you want a salad, choose a garden salad and ask for grilled meats. Avoid extra toppings like bacon, cheese, or fried items. Ask for the dressing on the side, or ask for olive oil and vinegar or lemon to use as dressing.  Estimate how many servings of a food you are given. For example, a serving of cooked rice is  cup or about the size of half a baseball. Knowing serving sizes will help you be aware of how much food you are eating at restaurants. The list below tells you how big or small some common portion sizes are based on everyday objects: ? 1 oz--4 stacked dice. ? 3 oz--1 deck of cards. ? 1 tsp--1 die. ? 1 Tbsp-- a ping-pong ball. ? 2 Tbsp--1 ping-pong ball. ?  cup-- baseball. ? 1 cup--1 baseball. Summary  Calorie counting means keeping track of how many calories you eat and drink each day. If you eat fewer calories than your body needs, you should lose weight.  A healthy amount of weight to lose per week is usually 1-2 lb (0.5-0.9 kg). This usually means reducing your daily calorie intake by 500-750 calories.  The number of calories in a food can be found on  a Nutrition Facts label. If a food does not have a Nutrition Facts label, try to look up the calories online or ask your dietitian for help.  Use your calories on foods and drinks that will fill you up, and not on foods and drinks that will leave you hungry.  Use smaller plates, glasses, and bowls to prevent overeating. This information is not intended to replace advice given to you by your health care provider. Make sure you discuss any questions you have with your health care provider. Document Released: 06/03/2005 Document Revised: 02/20/2018 Document Reviewed: 05/03/2016 Elsevier Patient Education  2020 [employer].

## 2019-04-23 NOTE — Progress Notes (Signed)
GYN ENCOUNTER NOTE  Subjective:       Linda Durham is a 23 y.o. G0P0000 female here for weight and blood pressure check.   Taking phentermine and b-12 PO daily.   No adverse reactions. Restarted gym this week. Working on diet.   Desires refill of POP, single sample left from [facility].   Denies headache, blurred vision, difficulty breathing or respiratory distress, chest pain, abdominal pain, excessive vaginal bleeding, dysuria, and leg pain or swelling.     Gynecologic History  Patient's last menstrual period was 04/21/2019 (exact date). Period Cycle (Days): 28 Period Duration (Days): 2 Period Pattern: Regular Menstrual Flow: Light Menstrual Control: Tampon, Panty liner Dysmenorrhea: (!) Mild Dysmenorrhea Symptoms: Cramping  Contraception: oral progesterone-only contraceptive, Slynd  Last Pap: 09/2016. Results were: normal   Obstetric History OB History  Gravida Para Term Preterm AB Living  0 0 0 0 0 0  SAB TAB Ectopic Multiple Live Births  0 0 0 0 0     Past Surgical History:  Procedure Laterality Date  . TONSILLECTOMY      Current Outpatient Medications on File Prior to Visit  Medication Sig Dispense Refill  . phentermine 37.5 MG capsule Take 1 capsule (37.5 mg total) by mouth every morning. 30 capsule 2  . vitamin B-12 (CYANOCOBALAMIN) 100 MCG tablet Take 1 tablet (100 mcg total) by mouth daily. 30 tablet 2   No current facility-administered medications on file prior to visit.     No Known Allergies  Social History   Socioeconomic History  . Marital status: Single    Spouse name: Not on file  . Number of children: Not on file  . Years of education: Not on file  . Highest education level: Not on file  Occupational History  . Not on file  Social Needs  . Financial resource strain: Not on file  . Food insecurity    Worry: Not on file    Inability: Not on file  . Transportation needs    Medical: Not on file    Non-medical: Not on file   Tobacco Use  . Smoking status: Never Smoker  . Smokeless tobacco: Never Used  Substance and Sexual Activity  . Alcohol use: Yes    Comment: Socially   . Drug use: No  . Sexual activity: Yes    Birth control/protection: Condom, Pill  Lifestyle  . Physical activity    Days per week: Not on file    Minutes per session: Not on file  . Stress: Not on file  Relationships  . Social Musician on phone: Not on file    Gets together: Not on file    Attends religious service: Not on file    Active member of club or organization: Not on file    Attends meetings of clubs or organizations: Not on file    Relationship status: Not on file  . Intimate partner violence    Fear of current or ex partner: Not on file    Emotionally abused: Not on file    Physically abused: Not on file    Forced sexual activity: Not on file  Other Topics Concern  . Not on file  Social History Narrative  . Not on file    Family History  Problem Relation Age of Onset  . Diabetes Father   . Heart disease Father   . Breast cancer Neg Hx   . Migraines Neg Hx   . Rashes / Skin  problems Neg Hx     The following portions of the patient's history were reviewed and updated as appropriate: allergies, current medications, past family history, past medical history, past social history, past surgical history and problem list.  Review of Systems  ROS negative except as noted above. Information obtained from patient.   Objective:   BP 115/81   Pulse 78   Ht 5\' 7"  (1.702 m)   Wt 219 lb 4.8 oz (99.5 kg)   LMP 04/21/2019 (Exact Date)   BMI 34.35 kg/m    CONSTITUTIONAL: Well-developed, well-nourished female   ABDOMEN: Waist circumference 39.5 inches.  Assessment:   1. Blood pressure check   2. BMI 34.0-34.9,adult   3. Surveillance for birth control, oral contraceptives   Plan:   Discussed lack of weight loss with medication.  Will continue this month as previously prescribed while  incorporating changes to diet and exercise.   Patient aware if no weight loss at next visit a new prescription will not be provided.   Advised treatment would last no longer than three (3) additional months for a total of six (6) months.   Rx: Slynd, see orders.   Reviewed red flag symptoms and when to call.   RTC x 1 month for weight and blood pressure check with Dr. Marcelline Mates or sooner if needed.    Diona Fanti, CNM [facility], [facility]

## 2019-05-20 ENCOUNTER — Encounter: Payer: Medicaid Other | Admitting: Obstetrics and Gynecology

## 2019-05-21 ENCOUNTER — Encounter: Payer: Medicaid Other | Admitting: Certified Nurse Midwife

## 2019-05-21 ENCOUNTER — Encounter: Payer: Medicaid Other | Admitting: Obstetrics and Gynecology

## 2019-06-28 ENCOUNTER — Telehealth: Payer: Self-pay | Admitting: Certified Nurse Midwife

## 2019-06-28 NOTE — Telephone Encounter (Signed)
Pt has been take her BP for months now pt stated that her periods are long lasting 8 days. Pt is requesting call from the nurse. please advise . Pt is a a Linda Durham pt

## 2019-06-30 NOTE — Telephone Encounter (Signed)
Called and spoke with patient.  Patient c/o longer menstrual periods since starting SLYND 3 months ago, <1PPH, lasting about 8 days.  Advised patient to allow more time for her body to adjust to OCP and if symptoms persist or get worse to let us know.  Also to make sure she is taking her OCP at the same time each day.  Patient verbalized understanding.  Patient states her menstrual periods would last 5-7 days on average without OCP.

## 2022-06-25 ENCOUNTER — Telehealth: Payer: Self-pay

## 2022-06-25 NOTE — Telephone Encounter (Signed)
Mychart msg sent. AS, CMA
